# Patient Record
Sex: Female | Born: 1990 | Race: Black or African American | Hispanic: No | Marital: Single | Smoking: Former smoker
Health system: Southern US, Community
[De-identification: ages and names within clinical notes are randomized; demographics above are authoritative.]

## PROBLEM LIST (undated history)

## (undated) DIAGNOSIS — J45909 Unspecified asthma, uncomplicated: Secondary | ICD-10-CM

## (undated) DIAGNOSIS — R519 Headache, unspecified: Secondary | ICD-10-CM

## (undated) DIAGNOSIS — I10 Essential (primary) hypertension: Secondary | ICD-10-CM

## (undated) DIAGNOSIS — K219 Gastro-esophageal reflux disease without esophagitis: Secondary | ICD-10-CM

## (undated) HISTORY — PX: WISDOM TOOTH EXTRACTION: SHX21

---

## 2013-10-18 HISTORY — PX: ECTOPIC PREGNANCY SURGERY: SHX613

## 2013-12-30 ENCOUNTER — Emergency Department (HOSPITAL_COMMUNITY)
Admission: EM | Admit: 2013-12-30 | Discharge: 2013-12-30 | Disposition: A | Discharge status: Home / Self Care | Payer: Self-pay | Attending: Emergency Medicine | Admitting: Emergency Medicine

## 2013-12-30 ENCOUNTER — Emergency Department (HOSPITAL_COMMUNITY): Payer: Self-pay

## 2013-12-30 ENCOUNTER — Encounter (HOSPITAL_COMMUNITY): Payer: Self-pay | Admitting: Emergency Medicine

## 2013-12-30 DIAGNOSIS — Y939 Activity, unspecified: Secondary | ICD-10-CM | POA: Insufficient documentation

## 2013-12-30 DIAGNOSIS — F17200 Nicotine dependence, unspecified, uncomplicated: Secondary | ICD-10-CM | POA: Insufficient documentation

## 2013-12-30 DIAGNOSIS — S61409A Unspecified open wound of unspecified hand, initial encounter: Secondary | ICD-10-CM | POA: Insufficient documentation

## 2013-12-30 DIAGNOSIS — S61218A Laceration without foreign body of other finger without damage to nail, initial encounter: Secondary | ICD-10-CM

## 2013-12-30 DIAGNOSIS — Y929 Unspecified place or not applicable: Secondary | ICD-10-CM | POA: Insufficient documentation

## 2013-12-30 DIAGNOSIS — W268XXA Contact with other sharp object(s), not elsewhere classified, initial encounter: Secondary | ICD-10-CM | POA: Insufficient documentation

## 2013-12-30 DIAGNOSIS — Z79899 Other long term (current) drug therapy: Secondary | ICD-10-CM | POA: Insufficient documentation

## 2013-12-30 DIAGNOSIS — S61209A Unspecified open wound of unspecified finger without damage to nail, initial encounter: Secondary | ICD-10-CM | POA: Insufficient documentation

## 2013-12-30 DIAGNOSIS — S61411A Laceration without foreign body of right hand, initial encounter: Secondary | ICD-10-CM

## 2013-12-30 DIAGNOSIS — Z792 Long term (current) use of antibiotics: Secondary | ICD-10-CM | POA: Insufficient documentation

## 2013-12-30 DIAGNOSIS — I10 Essential (primary) hypertension: Secondary | ICD-10-CM | POA: Insufficient documentation

## 2013-12-30 DIAGNOSIS — IMO0001 Reserved for inherently not codable concepts without codable children: Secondary | ICD-10-CM | POA: Insufficient documentation

## 2013-12-30 HISTORY — DX: Essential (primary) hypertension: I10

## 2013-12-30 MED ORDER — CEPHALEXIN 250 MG PO CAPS
500.0000 mg | ORAL_CAPSULE | Freq: Once | ORAL | Status: AC
  Administered 2013-12-30: 500 mg via ORAL
  Filled 2013-12-30: qty 2

## 2013-12-30 MED ORDER — IBUPROFEN 200 MG PO TABS
600.0000 mg | ORAL_TABLET | Freq: Once | ORAL | Status: AC
  Administered 2013-12-30: 600 mg via ORAL
  Filled 2013-12-30: qty 3

## 2013-12-30 MED ORDER — IBUPROFEN 600 MG PO TABS: 600.0000 mg | ORAL_TABLET | Freq: Four times a day (QID) | ORAL | Status: DC | PRN

## 2013-12-30 MED ORDER — CEPHALEXIN 500 MG PO CAPS: 500.0000 mg | ORAL_CAPSULE | Freq: Four times a day (QID) | ORAL | Status: DC

## 2013-12-30 NOTE — Discharge Instructions (Signed)
We saw you in the ER for your WOUND. Please read the instructions provided on wound care. Keep the area clean and dry, apply bacitracin or neosporin ointment daily and take the medications provided. RETURN TO THE ER IF THERE IS INCREASED PAIN, REDNESS, PUS COMING OUT from the wound site.   Laceration Care, Adult A laceration is a cut or lesion that goes through all layers of the skin and into the tissue just beneath the skin. TREATMENT  Some lacerations may not require closure. Some lacerations may not be able to be closed due to an increased risk of infection. It is important to see your caregiver as soon as possible after an injury to minimize the risk of infection and maximize the opportunity for successful closure. If closure is appropriate, pain medicines may be given, if needed. The wound will be cleaned to help prevent infection. Your caregiver will use stitches (sutures), staples, wound glue (adhesive), or skin adhesive strips to repair the laceration. These tools bring the skin edges together to allow for faster healing and a better cosmetic outcome. However, all wounds will heal with a scar. Once the wound has healed, scarring can be minimized by covering the wound with sunscreen during the day for 1 full year. HOME CARE INSTRUCTIONS  For sutures or staples:  Keep the wound clean and dry.  If you were given a bandage (dressing), you should change it at least once a day. Also, change the dressing if it becomes wet or dirty, or as directed by your caregiver.  Wash the wound with soap and water 2 times a day. Rinse the wound off with water to remove all soap. Pat the wound dry with a clean towel.  After cleaning, apply a thin layer of the antibiotic ointment as recommended by your caregiver. This will help prevent infection and keep the dressing from sticking.  You may shower as usual after the first 24 hours. Do not soak the wound in water until the sutures are removed.  Only take  over-the-counter or prescription medicines for pain, discomfort, or fever as directed by your caregiver.  Get your sutures or staples removed as directed by your caregiver. For skin adhesive strips:  Keep the wound clean and dry.  Do not get the skin adhesive strips wet. You may bathe carefully, using caution to keep the wound dry.  If the wound gets wet, pat it dry with a clean towel.  Skin adhesive strips will fall off on their own. You may trim the strips as the wound heals. Do not remove skin adhesive strips that are still stuck to the wound. They will fall off in time. For wound adhesive:  You may briefly wet your wound in the shower or bath. Do not soak or scrub the wound. Do not swim. Avoid periods of heavy perspiration until the skin adhesive has fallen off on its own. After showering or bathing, gently pat the wound dry with a clean towel.  Do not apply liquid medicine, cream medicine, or ointment medicine to your wound while the skin adhesive is in place. This may loosen the film before your wound is healed.  If a dressing is placed over the wound, be careful not to apply tape directly over the skin adhesive. This may cause the adhesive to be pulled off before the wound is healed.  Avoid prolonged exposure to sunlight or tanning lamps while the skin adhesive is in place. Exposure to ultraviolet light in the first year will darken the  scar.  The skin adhesive will usually remain in place for 5 to 10 days, then naturally fall off the skin. Do not pick at the adhesive film. You may need a tetanus shot if:  You cannot remember when you had your last tetanus shot.  You have never had a tetanus shot. If you get a tetanus shot, your arm may swell, get red, and feel warm to the touch. This is common and not a problem. If you need a tetanus shot and you choose not to have one, there is a rare chance of getting tetanus. Sickness from tetanus can be serious. SEEK MEDICAL CARE IF:   You  have redness, swelling, or increasing pain in the wound.  You see a red line that goes away from the wound.  You have yellowish-white fluid (pus) coming from the wound.  You have a fever.  You notice a bad smell coming from the wound or dressing.  Your wound breaks open before or after sutures have been removed.  You notice something coming out of the wound such as wood or glass.  Your wound is on your hand or foot and you cannot move a finger or toe. SEEK IMMEDIATE MEDICAL CARE IF:   Your pain is not controlled with prescribed medicine.  You have severe swelling around the wound causing pain and numbness or a change in color in your arm, hand, leg, or foot.  Your wound splits open and starts bleeding.  You have worsening numbness, weakness, or loss of function of any joint around or beyond the wound.  You develop painful lumps near the wound or on the skin anywhere on your body. MAKE SURE YOU:   Understand these instructions.  Will watch your condition.  Will get help right away if you are not doing well or get worse. Document Released: 10/04/2005 Document Revised: 12/27/2011 Document Reviewed: 03/30/2011 [facility] Patient Information 2014 [employer], [place].

## 2013-12-30 NOTE — ED Provider Notes (Signed)
CSN: [id number]     Arrival date & time 12/30/13  0053 History   First MD Initiated Contact with Patient 12/30/13 0229     Chief Complaint  Patient presents with  . Extremity Laceration     (Consider location/radiation/quality/duration/timing/severity/associated sxs/prior Treatment) HPI Comments: SUBJECTIVE:  23 y.o. female sustained laceration of hand and finger prior to ED arrival. Ashby Dawes of injury: accidentally hit a glass window. Tetanus vaccination status reviewed: last tetanus booster within 10 years. Bleeding is well controlled and patient has pain at the site of the injury, no numbness. Denies intoxication. Injury to right hand, and patient is right handed    The history is provided by the patient.    Past Medical History  Diagnosis Date  . Hypertension    History reviewed. No pertinent past surgical history. History reviewed. No pertinent family history. History  Substance Use Topics  . Smoking status: Current Some Day Smoker    Types: Cigars  . Smokeless tobacco: Not on file  . Alcohol Use: Yes   OB History   Grav Para Term Preterm Abortions TAB SAB Ect Mult Living                 Review of Systems  Musculoskeletal: Positive for myalgias.  Skin: Positive for wound.  Neurological: Negative for headaches.  Hematological: Does not bruise/bleed easily.      Allergies  Review of patient's allergies indicates no known allergies.  Home Medications   Current Outpatient Rx  Name  Route  Sig  Dispense  Refill  . hydrochlorothiazide (HYDRODIURIL) 25 MG tablet   Oral   Take 25 mg by mouth daily.         Marland Kitchen labetalol (NORMODYNE) 200 MG tablet   Oral   Take 200 mg by mouth 2 (two) times daily.         . cephALEXin (KEFLEX) 500 MG capsule   Oral   Take 1 capsule (500 mg total) by mouth 4 (four) times daily.   20 capsule   0   . ibuprofen (ADVIL,MOTRIN) 600 MG tablet   Oral   Take 1 tablet (600 mg total) by mouth every 6 (six) hours as needed.   30  tablet   0    BP 158/110  Pulse 122  Temp(Src) 100 F (37.8 C) (Oral)  Resp 18  SpO2 96%  LMP 10/01/2013 Physical Exam  Nursing note and vitals reviewed. Constitutional: She appears well-developed.  Eyes: EOM are normal.  Neck: Neck supple.  Pulmonary/Chest: Effort normal and breath sounds normal.  Musculoskeletal:  Right wrist- there is a 5 cm laceration, deep at the ulnar aspect, volar side that extends laterally. No tendon exposure, no active bleeding.  Right small finger, between the pip and dip, there is a 1 cm laceration on the dorsal aspect.  Pt able to flex and extend over all the ip joints and mcp joints, and able to flex and extend the wrist. Pt's sensory exam is normal, and she is able to discriminate between sharp and dull, and sensation is equal throughout the hand  Neurological: She is alert.  Skin: Skin is warm and dry.    ED Course  Procedures (including critical care time) Labs Review Labs Reviewed - No data to display Imaging Review Dg Wrist Complete Right  12/30/2013   CLINICAL DATA:  Laceration  EXAM: RIGHT WRIST - COMPLETE 3+ VIEW  COMPARISON:  None.  FINDINGS: Medial laceration at the wrist. Negative for foreign body or fracture.  IMPRESSION: Negative for fracture or foreign body.   Electronically Signed   By: Marlan Palauharles  Clark M.D.   On: 12/30/2013 03:38     EKG Interpretation None      MDM   Final diagnoses:  Laceration of hand, right  Laceration of finger, little   LACERATION REPAIR Performed by: Derwood KaplanNanavati, Thane Age Authorized by: Derwood KaplanNanavati, Jyoti Harju Consent: Verbal consent obtained. Risks and benefits: risks, benefits and alternatives were discussed Consent given by: patient Patient identity confirmed: provided demographic data Prepped and Draped in normal sterile fashion Wound explored  Laceration Location: right wrist and right small finger  Laceration Length: 6 cm  No Foreign Bodies seen or palpated  Anesthesia: local  infiltration  Local anesthetic: lidocaine 1 % W/ epinephrine  Anesthetic total: 4 ml  Irrigation method: syringe Amount of cleaning: standard  Skin closure: primary  Number of sutures: 11 total  Technique: simple interrupted  Patient tolerance: Patient tolerated the procedure well with no immediate complications.  Pt comes in post laceration repair. Wound was copiously irrigated, and stitched up. Return precautions discussed. Tetanus UTD. Keflex given for prophylaxis.   Derwood KaplanAnkit Kyeisha Janowicz, MD 12/30/13 [phone]

## 2013-12-30 NOTE — ED Notes (Signed)
Rhunette CroftNanavati, MD is aware of the pt's blood pressure, and consents to the let the pt leave the hospital.

## 2013-12-30 NOTE — ED Notes (Addendum)
PResetns with laceration to right posterior wrist from a glass window. Bleeding controlled. Small laceration to pinky finger. Wrapped with gauze, CMS intact.  Admits to ETOH use.

## 2014-07-03 ENCOUNTER — Emergency Department (HOSPITAL_COMMUNITY)
Admission: EM | Admit: 2014-07-03 | Discharge: 2014-07-03 | Disposition: A | Discharge status: Home / Self Care | Payer: Self-pay | Attending: Emergency Medicine | Admitting: Emergency Medicine

## 2014-07-03 ENCOUNTER — Encounter (HOSPITAL_COMMUNITY): Payer: Self-pay | Admitting: Emergency Medicine

## 2014-07-03 DIAGNOSIS — R197 Diarrhea, unspecified: Secondary | ICD-10-CM | POA: Insufficient documentation

## 2014-07-03 DIAGNOSIS — R748 Abnormal levels of other serum enzymes: Secondary | ICD-10-CM | POA: Insufficient documentation

## 2014-07-03 DIAGNOSIS — Z79899 Other long term (current) drug therapy: Secondary | ICD-10-CM | POA: Insufficient documentation

## 2014-07-03 DIAGNOSIS — R109 Unspecified abdominal pain: Secondary | ICD-10-CM | POA: Insufficient documentation

## 2014-07-03 DIAGNOSIS — I10 Essential (primary) hypertension: Secondary | ICD-10-CM | POA: Insufficient documentation

## 2014-07-03 DIAGNOSIS — Z3202 Encounter for pregnancy test, result negative: Secondary | ICD-10-CM | POA: Insufficient documentation

## 2014-07-03 DIAGNOSIS — F17200 Nicotine dependence, unspecified, uncomplicated: Secondary | ICD-10-CM | POA: Insufficient documentation

## 2014-07-03 DIAGNOSIS — R112 Nausea with vomiting, unspecified: Secondary | ICD-10-CM | POA: Insufficient documentation

## 2014-07-03 LAB — COMPREHENSIVE METABOLIC PANEL
ALT: 17 U/L (ref 0–35)
AST: 18 U/L (ref 0–37)
Albumin: 3.4 g/dL — ABNORMAL LOW (ref 3.5–5.2)
Alkaline Phosphatase: 110 U/L (ref 39–117)
Anion gap: 12 (ref 5–15)
BUN: 6 mg/dL (ref 6–23)
CALCIUM: 9.1 mg/dL (ref 8.4–10.5)
CO2: 25 mEq/L (ref 19–32)
Chloride: 101 mEq/L (ref 96–112)
Creatinine, Ser: 0.76 mg/dL (ref 0.50–1.10)
GFR calc non Af Amer: >90 mL/min (ref >90)
GLUCOSE: 110 mg/dL — AB (ref 70–99)
Potassium: 3.4 mEq/L — ABNORMAL LOW (ref 3.7–5.3)
SODIUM: 138 meq/L (ref 137–147)
TOTAL PROTEIN: 7.6 g/dL (ref 6.0–8.3)
Total Bilirubin: 0.5 mg/dL (ref 0.3–1.2)

## 2014-07-03 LAB — URINALYSIS, ROUTINE W REFLEX MICROSCOPIC
Bilirubin Urine: NEGATIVE
GLUCOSE, UA: NEGATIVE mg/dL
Hgb urine dipstick: NEGATIVE
Ketones, ur: 15 mg/dL — AB
Leukocytes, UA: NEGATIVE
NITRITE: NEGATIVE
PROTEIN: NEGATIVE mg/dL
Specific Gravity, Urine: 1.021 (ref 1.005–1.030)
UROBILINOGEN UA: 0.2 mg/dL (ref 0.0–1.0)
pH: 6.5 (ref 5.0–8.0)

## 2014-07-03 LAB — CBC WITH DIFFERENTIAL/PLATELET
BASOS ABS: 0 10*3/uL (ref 0.0–0.1)
Basophils Relative: 0 % (ref 0–1)
EOS PCT: 1 % (ref 0–5)
Eosinophils Absolute: 0.1 10*3/uL (ref 0.0–0.7)
HCT: 41.7 % (ref 36.0–46.0)
Hemoglobin: 13.9 g/dL (ref 12.0–15.0)
LYMPHS ABS: 2.8 10*3/uL (ref 0.7–4.0)
Lymphocytes Relative: 33 % (ref 12–46)
MCH: 26.4 pg (ref 26.0–34.0)
MCHC: 33.3 g/dL (ref 30.0–36.0)
MCV: 79.3 fL (ref 78.0–100.0)
Monocytes Absolute: 0.4 10*3/uL (ref 0.1–1.0)
Monocytes Relative: 5 % (ref 3–12)
Neutro Abs: 5.2 10*3/uL (ref 1.7–7.7)
Neutrophils Relative %: 61 % (ref 43–77)
PLATELETS: 325 10*3/uL (ref 150–400)
RBC: 5.26 MIL/uL — ABNORMAL HIGH (ref 3.87–5.11)
RDW: 13.8 % (ref 11.5–15.5)
WBC: 8.5 10*3/uL (ref 4.0–10.5)

## 2014-07-03 LAB — LIPASE, BLOOD: Lipase: 109 U/L — ABNORMAL HIGH (ref 11–59)

## 2014-07-03 LAB — POC URINE PREG, ED: PREG TEST UR: NEGATIVE

## 2014-07-03 MED ORDER — LOPERAMIDE HCL 2 MG PO CAPS
4.0000 mg | ORAL_CAPSULE | Freq: Once | ORAL | Status: AC
  Administered 2014-07-03: 4 mg via ORAL
  Filled 2014-07-03: qty 2

## 2014-07-03 MED ORDER — SODIUM CHLORIDE 0.9 % IV BOLUS (SEPSIS): 1000.0000 mL | Freq: Once | INTRAVENOUS | Status: DC

## 2014-07-03 MED ORDER — ONDANSETRON 4 MG PO TBDP: 4.0000 mg | ORAL_TABLET | Freq: Three times a day (TID) | ORAL | Status: DC | PRN

## 2014-07-03 MED ORDER — LOPERAMIDE HCL 2 MG PO CAPS: 2.0000 mg | ORAL_CAPSULE | Freq: Four times a day (QID) | ORAL | Status: DC | PRN

## 2014-07-03 NOTE — Discharge Instructions (Signed)
Please read and follow all provided instructions.  Your diagnoses today include:  1. Nausea vomiting and diarrhea   2. Essential hypertension   3. Elevated lipase     Tests performed today include:  Blood counts and electrolytes  Blood tests to check liver and kidney function  Blood tests to check pancreas function - mildly high  Urine test to look for infection and pregnancy (in women)  Vital signs. See below for your results today.   Medications prescribed:   Imodium - medication for diarrhea   Zofran (ondansetron) - for nausea and vomiting  Take any prescribed medications only as directed.  Home care instructions:   Follow any educational materials contained in this packet.   Your abdominal pain, nausea, vomiting, and diarrhea may be caused by a viral gastroenteritis also called 'stomach flu'. You should rest for the next several days. Keep drinking plenty of fluids and use the medicine for nausea as directed.    Drink clear liquids for the next 24 hours and introduce solid foods slowly after 24 hours using the b.r.a.t. diet (Bananas, Rice, Applesauce, Toast, Yogurt).    Follow-up instructions: Please follow-up with your primary care provider in the next 7 days for further evaluation of your symptoms and elevated lipase.  Return instructions:  SEEK IMMEDIATE MEDICAL ATTENTION IF:  If you have pain that does not go away or becomes severe   A temperature above 101F develops   Repeated vomiting occurs (multiple episodes)   If you have pain that becomes localized to portions of the abdomen. The right side could possibly be appendicitis. In an adult, the left lower portion of the abdomen could be colitis or diverticulitis.   Blood is being passed in stools or vomit (bright red or black tarry stools)   You develop chest pain, difficulty breathing, dizziness or fainting, or become confused, poorly responsive, or inconsolable (young children)  If you have any other  emergent concerns regarding your health  Additional Information: Abdominal (belly) pain can be caused by many things. Your caregiver performed an examination and possibly ordered blood/urine tests and imaging (CT scan, x-rays, ultrasound). Many cases can be observed and treated at home after initial evaluation in the emergency department. Even though you are being discharged home, abdominal pain can be unpredictable. Therefore, you need a repeated exam if your pain does not resolve, returns, or worsens. Most patients with abdominal pain don't have to be admitted to the hospital or have surgery, but serious problems like appendicitis and gallbladder attacks can start out as nonspecific pain. Many abdominal conditions cannot be diagnosed in one visit, so follow-up evaluations are very important.  Your vital signs today were: BP 148/119   Pulse 94   Temp(Src) 98.8 F (37.1 C) (Oral)   Resp 18   SpO2 100% If your blood pressure (bp) was elevated above 135/85 this visit, please have this repeated by your doctor within one month. --------------

## 2014-07-03 NOTE — ED Notes (Signed)
Patient discharged with all personal belongings. 

## 2014-07-03 NOTE — ED Notes (Addendum)
Patient given PO fluids, tolerated without any difficulty. PA made aware of patients elevated BP. Patient states she has not taken any of her home BP medication today. Educated patient on the risk factors of not taking her medication and having an elevated BP for extended period of time.

## 2014-07-03 NOTE — ED Notes (Signed)
Pt in c/o n/v/d and abd cramping for two days, denies fever at home, no distress noted

## 2014-07-03 NOTE — ED Provider Notes (Signed)
CSN: [id number]     Arrival date & time 07/03/14  1552 History   First MD Initiated Contact with Patient 07/03/14 1924     Chief Complaint  Patient presents with  . N/V/D      (Consider location/radiation/quality/duration/timing/severity/associated sxs/prior Treatment) HPI Comments: Patient presents with complaint of nausea, vomiting, and diarrhea for 2 days. Patient has had abdominal pain only with urge to vomit or have a bowel movement and the pain is not persistent. This pain is cramping and generalized and does not radiate. Patient has not had a fever. She has not had any dysuria, hematuria. Vomiting is nonbloody, nonbilious. Patient has not noted melena or blood in stool. Patient states that her last menstrual period was one month ago and was normal for her. No treatments prior to arrival. Patient should be taking HCTZ at home but is noncompliant. She denies heavy NSAID use or heavy alcohol use. No recent exotic travel however patient recently moved to [place] from [place] 2 weeks ago. No recent antibiotic use. No history of Crohn's disease or ulcerative colitis. She has been eating light foods and drinking water at home.   The history is provided by the patient.    Past Medical History  Diagnosis Date  . Hypertension    History reviewed. No pertinent past surgical history. History reviewed. No pertinent family history. History  Substance Use Topics  . Smoking status: Current Some Day Smoker    Types: Cigars  . Smokeless tobacco: Not on file  . Alcohol Use: Yes   OB History   Grav Para Term Preterm Abortions TAB SAB Ect Mult Living                 Review of Systems  Constitutional: Negative for fever.  HENT: Negative for rhinorrhea and sore throat.   Eyes: Negative for redness.  Respiratory: Negative for cough.   Cardiovascular: Negative for chest pain.  Gastrointestinal: Positive for nausea, vomiting, abdominal pain and diarrhea. Negative for constipation and  blood in stool.  Genitourinary: Negative for dysuria.  Musculoskeletal: Negative for myalgias.  Skin: Negative for rash.  Neurological: Negative for headaches.    Allergies  Review of patient's allergies indicates no known allergies.  Home Medications   Prior to Admission medications   Medication Sig Start Date End Date Taking? Authorizing Provider  hydrochlorothiazide (HYDRODIURIL) 25 MG tablet Take 25 mg by mouth daily.   Yes Historical Provider, MD  ibuprofen (ADVIL,MOTRIN) 200 MG tablet Take 400 mg by mouth every 6 (six) hours as needed for mild pain.   Yes Historical Provider, MD  labetalol (NORMODYNE) 200 MG tablet Take 200 mg by mouth 2 (two) times daily.   Yes Historical Provider, MD   BP 155/108  Pulse 116  Temp(Src) 98.8 F (37.1 C) (Oral)  Resp 18  SpO2 98%  Physical Exam  Nursing note and vitals reviewed. Constitutional: She appears well-developed and well-nourished.  HENT:  Head: Normocephalic and atraumatic.  Mouth/Throat: Oropharynx is clear and moist.  Eyes: Conjunctivae are normal. Right eye exhibits no discharge. Left eye exhibits no discharge.  Neck: Normal range of motion. Neck supple.  Cardiovascular: Normal rate, regular rhythm and normal heart sounds.   Pulmonary/Chest: Effort normal and breath sounds normal. No respiratory distress. She has no wheezes. She has no rales.  Abdominal: Soft. Bowel sounds are normal. She exhibits no distension. There is no tenderness. There is no rebound and no guarding.  Obese  Neurological: She is alert.  Skin: Skin is warm  and dry.  Psychiatric: She has a normal mood and affect.    ED Course  Procedures (including critical care time) Labs Review Labs Reviewed  CBC WITH DIFFERENTIAL - Abnormal; Notable for the following:    RBC 5.26 (*)    All other components within normal limits  COMPREHENSIVE METABOLIC PANEL - Abnormal; Notable for the following:    Potassium 3.4 (*)    Glucose, Bld 110 (*)    Albumin 3.4  (*)    All other components within normal limits  LIPASE, BLOOD - Abnormal; Notable for the following:    Lipase 109 (*)    All other components within normal limits  URINALYSIS, ROUTINE W REFLEX MICROSCOPIC - Abnormal; Notable for the following:    Color, Urine AMBER (*)    APPearance CLOUDY (*)    Ketones, ur 15 (*)    All other components within normal limits  POC URINE PREG, ED    Imaging Review No results found.   EKG Interpretation None      Patient seen and examined. Work-up initiated. Medications ordered. She declines IV fluids, will ensure she drinks without vomiting.   Family/patient advised of elevated lipase and to be aware of this, have this rechecked in future. Pt has no pain, I explained I do not think this is pancreatitis or related to gallstones.   Vital signs reviewed and are as follows: BP 155/108  Pulse 116  Temp(Src) 98.8 F (37.1 C) (Oral)  Resp 18  SpO2 98%  Pt discharged with zofran and imodium for symptom control.   BP 144/122  Pulse 79  Temp(Src) 98.8 F (37.1 C) (Oral)  Resp 16  SpO2 100%  The patient was urged to return to the Emergency Department immediately with worsening of current symptoms, worsening abdominal pain, persistent vomiting, blood noted in stools, fever, or any other concerns. The patient verbalized understanding.    MDM   Final diagnoses:  Nausea vomiting and diarrhea  Essential hypertension  Elevated lipase   N/V/D: no clinical signs of dehydration. Labs are reassuring however lipase is elevated. Patient does not have any clinical signs and symptoms of pancreatitis. I do not suspect bile duct obstruction. Her abdomen is soft and nontender. No indication for additional imaging tonight.  Hypertension: Patient has been noncompliant with her oral medications, she is counseled to resume these. No gross evidence of end-organ damage.     Renne Crigler, PA-C 07/04/14 [phone]

## 2014-07-04 NOTE — ED Provider Notes (Signed)
Medical screening examination/treatment/procedure(s) were performed by non-physician practitioner and as supervising physician I was immediately available for consultation/collaboration.   EKG Interpretation None       Arby Barrette, MD 07/04/14 [phone]

## 2018-10-18 HISTORY — PX: DEEP SHOULDER TUMOR EXCISION: SUR577

## 2020-10-08 ENCOUNTER — Other Ambulatory Visit: Payer: Self-pay

## 2020-10-08 ENCOUNTER — Encounter (HOSPITAL_COMMUNITY): Payer: Self-pay

## 2020-10-08 DIAGNOSIS — D509 Iron deficiency anemia, unspecified: Secondary | ICD-10-CM | POA: Diagnosis present

## 2020-10-08 DIAGNOSIS — K8020 Calculus of gallbladder without cholecystitis without obstruction: Principal | ICD-10-CM | POA: Diagnosis present

## 2020-10-08 DIAGNOSIS — Z888 Allergy status to other drugs, medicaments and biological substances status: Secondary | ICD-10-CM

## 2020-10-08 DIAGNOSIS — F17290 Nicotine dependence, other tobacco product, uncomplicated: Secondary | ICD-10-CM | POA: Diagnosis present

## 2020-10-08 DIAGNOSIS — E876 Hypokalemia: Secondary | ICD-10-CM | POA: Diagnosis present

## 2020-10-08 DIAGNOSIS — Z79899 Other long term (current) drug therapy: Secondary | ICD-10-CM

## 2020-10-08 DIAGNOSIS — Z6841 Body Mass Index (BMI) 40.0 and over, adult: Secondary | ICD-10-CM

## 2020-10-08 DIAGNOSIS — I10 Essential (primary) hypertension: Secondary | ICD-10-CM | POA: Diagnosis present

## 2020-10-08 DIAGNOSIS — K219 Gastro-esophageal reflux disease without esophagitis: Secondary | ICD-10-CM | POA: Diagnosis present

## 2020-10-08 DIAGNOSIS — E875 Hyperkalemia: Secondary | ICD-10-CM | POA: Diagnosis present

## 2020-10-08 DIAGNOSIS — D72819 Decreased white blood cell count, unspecified: Secondary | ICD-10-CM | POA: Diagnosis present

## 2020-10-08 DIAGNOSIS — R000 Tachycardia, unspecified: Secondary | ICD-10-CM | POA: Diagnosis present

## 2020-10-08 DIAGNOSIS — J45909 Unspecified asthma, uncomplicated: Secondary | ICD-10-CM | POA: Diagnosis present

## 2020-10-08 DIAGNOSIS — U071 COVID-19: Secondary | ICD-10-CM | POA: Diagnosis present

## 2020-10-08 DIAGNOSIS — R7989 Other specified abnormal findings of blood chemistry: Secondary | ICD-10-CM | POA: Diagnosis present

## 2020-10-08 DIAGNOSIS — Z23 Encounter for immunization: Secondary | ICD-10-CM

## 2020-10-08 LAB — URINALYSIS, ROUTINE W REFLEX MICROSCOPIC
Bacteria, UA: NONE SEEN
Glucose, UA: NEGATIVE mg/dL
Hgb urine dipstick: NEGATIVE
Ketones, ur: 5 mg/dL — AB
Leukocytes,Ua: NEGATIVE
Nitrite: NEGATIVE
Protein, ur: 30 mg/dL — AB
Specific Gravity, Urine: 1.018 (ref 1.005–1.030)
pH: 6 (ref 5.0–8.0)

## 2020-10-08 LAB — CBC
HCT: 38.7 % (ref 36.0–46.0)
Hemoglobin: 12 g/dL (ref 12.0–15.0)
MCH: 23.2 pg — ABNORMAL LOW (ref 26.0–34.0)
MCHC: 31 g/dL (ref 30.0–36.0)
MCV: 74.7 fL — ABNORMAL LOW (ref 80.0–100.0)
Platelets: 371 10*3/uL (ref 150–400)
RBC: 5.18 MIL/uL — ABNORMAL HIGH (ref 3.87–5.11)
RDW: 16.5 % — ABNORMAL HIGH (ref 11.5–15.5)
WBC: 3.7 10*3/uL — ABNORMAL LOW (ref 4.0–10.5)
nRBC: 0 % (ref 0.0–0.2)

## 2020-10-08 LAB — COMPREHENSIVE METABOLIC PANEL
ALT: 340 U/L — ABNORMAL HIGH (ref 0–44)
AST: 732 U/L — ABNORMAL HIGH (ref 15–41)
Albumin: 3.9 g/dL (ref 3.5–5.0)
Alkaline Phosphatase: 232 U/L — ABNORMAL HIGH (ref 38–126)
Anion gap: 12 (ref 5–15)
BUN: 9 mg/dL (ref 6–20)
CO2: 27 mmol/L (ref 22–32)
Calcium: 9 mg/dL (ref 8.9–10.3)
Chloride: 100 mmol/L (ref 98–111)
Creatinine, Ser: 0.73 mg/dL (ref 0.44–1.00)
GFR, Estimated: >60 mL/min (ref >60)
Glucose, Bld: 107 mg/dL — ABNORMAL HIGH (ref 70–99)
Potassium: 3 mmol/L — ABNORMAL LOW (ref 3.5–5.1)
Sodium: 139 mmol/L (ref 135–145)
Total Bilirubin: 2.8 mg/dL — ABNORMAL HIGH (ref 0.3–1.2)
Total Protein: 8.1 g/dL (ref 6.5–8.1)

## 2020-10-08 LAB — I-STAT BETA HCG BLOOD, ED (MC, WL, AP ONLY): I-stat hCG, quantitative: <5 m[IU]/mL (ref <5)

## 2020-10-08 LAB — LIPASE, BLOOD: Lipase: 26 U/L (ref 11–51)

## 2020-10-08 NOTE — ED Triage Notes (Signed)
Pt reports lower back and upper abdominal pain beginning this morning. Pt sts omeprazole usually helps but did not today.

## 2020-10-09 ENCOUNTER — Emergency Department (HOSPITAL_COMMUNITY): Payer: Medicaid Other

## 2020-10-09 ENCOUNTER — Observation Stay (HOSPITAL_COMMUNITY): Payer: Medicaid Other

## 2020-10-09 ENCOUNTER — Inpatient Hospital Stay (HOSPITAL_COMMUNITY)
Admission: EM | Admit: 2020-10-09 | Discharge: 2020-10-10 | DRG: 444 | Disposition: A | Discharge status: Home / Self Care | Payer: Medicaid Other | Attending: Internal Medicine | Admitting: Internal Medicine

## 2020-10-09 DIAGNOSIS — Z79899 Other long term (current) drug therapy: Secondary | ICD-10-CM | POA: Diagnosis not present

## 2020-10-09 DIAGNOSIS — I10 Essential (primary) hypertension: Secondary | ICD-10-CM | POA: Diagnosis present

## 2020-10-09 DIAGNOSIS — J45909 Unspecified asthma, uncomplicated: Secondary | ICD-10-CM | POA: Diagnosis present

## 2020-10-09 DIAGNOSIS — D509 Iron deficiency anemia, unspecified: Secondary | ICD-10-CM | POA: Diagnosis present

## 2020-10-09 DIAGNOSIS — K8020 Calculus of gallbladder without cholecystitis without obstruction: Secondary | ICD-10-CM | POA: Diagnosis present

## 2020-10-09 DIAGNOSIS — R7989 Other specified abnormal findings of blood chemistry: Secondary | ICD-10-CM | POA: Diagnosis present

## 2020-10-09 DIAGNOSIS — D72819 Decreased white blood cell count, unspecified: Secondary | ICD-10-CM | POA: Diagnosis present

## 2020-10-09 DIAGNOSIS — R933 Abnormal findings on diagnostic imaging of other parts of digestive tract: Secondary | ICD-10-CM

## 2020-10-09 DIAGNOSIS — K219 Gastro-esophageal reflux disease without esophagitis: Secondary | ICD-10-CM | POA: Diagnosis present

## 2020-10-09 DIAGNOSIS — Z23 Encounter for immunization: Secondary | ICD-10-CM | POA: Diagnosis not present

## 2020-10-09 DIAGNOSIS — R000 Tachycardia, unspecified: Secondary | ICD-10-CM | POA: Diagnosis present

## 2020-10-09 DIAGNOSIS — Z6841 Body Mass Index (BMI) 40.0 and over, adult: Secondary | ICD-10-CM | POA: Diagnosis not present

## 2020-10-09 DIAGNOSIS — E66813 Obesity, class 3: Secondary | ICD-10-CM | POA: Diagnosis present

## 2020-10-09 DIAGNOSIS — Z888 Allergy status to other drugs, medicaments and biological substances status: Secondary | ICD-10-CM | POA: Diagnosis not present

## 2020-10-09 DIAGNOSIS — U071 COVID-19: Secondary | ICD-10-CM | POA: Diagnosis present

## 2020-10-09 DIAGNOSIS — E876 Hypokalemia: Secondary | ICD-10-CM | POA: Diagnosis present

## 2020-10-09 DIAGNOSIS — F17290 Nicotine dependence, other tobacco product, uncomplicated: Secondary | ICD-10-CM | POA: Diagnosis present

## 2020-10-09 DIAGNOSIS — R1011 Right upper quadrant pain: Secondary | ICD-10-CM | POA: Diagnosis present

## 2020-10-09 DIAGNOSIS — E875 Hyperkalemia: Secondary | ICD-10-CM | POA: Diagnosis present

## 2020-10-09 DIAGNOSIS — K8050 Calculus of bile duct without cholangitis or cholecystitis without obstruction: Secondary | ICD-10-CM

## 2020-10-09 LAB — CBC
HCT: 35 % — ABNORMAL LOW (ref 36.0–46.0)
Hemoglobin: 10.9 g/dL — ABNORMAL LOW (ref 12.0–15.0)
MCH: 23.3 pg — ABNORMAL LOW (ref 26.0–34.0)
MCHC: 31.1 g/dL (ref 30.0–36.0)
MCV: 74.8 fL — ABNORMAL LOW (ref 80.0–100.0)
Platelets: 309 10*3/uL (ref 150–400)
RBC: 4.68 MIL/uL (ref 3.87–5.11)
RDW: 16.4 % — ABNORMAL HIGH (ref 11.5–15.5)
WBC: 4.4 10*3/uL (ref 4.0–10.5)
nRBC: 0 % (ref 0.0–0.2)

## 2020-10-09 LAB — COMPREHENSIVE METABOLIC PANEL
ALT: 344 U/L — ABNORMAL HIGH (ref 0–44)
AST: 545 U/L — ABNORMAL HIGH (ref 15–41)
Albumin: 3.5 g/dL (ref 3.5–5.0)
Alkaline Phosphatase: 220 U/L — ABNORMAL HIGH (ref 38–126)
Anion gap: 12 (ref 5–15)
BUN: 7 mg/dL (ref 6–20)
CO2: 26 mmol/L (ref 22–32)
Calcium: 8.4 mg/dL — ABNORMAL LOW (ref 8.9–10.3)
Chloride: 102 mmol/L (ref 98–111)
Creatinine, Ser: 0.64 mg/dL (ref 0.44–1.00)
GFR, Estimated: >60 mL/min (ref >60)
Glucose, Bld: 135 mg/dL — ABNORMAL HIGH (ref 70–99)
Potassium: 2.6 mmol/L — CL (ref 3.5–5.1)
Sodium: 140 mmol/L (ref 135–145)
Total Bilirubin: 3.6 mg/dL — ABNORMAL HIGH (ref 0.3–1.2)
Total Protein: 7.7 g/dL (ref 6.5–8.1)

## 2020-10-09 LAB — MAGNESIUM: Magnesium: 1.7 mg/dL (ref 1.7–2.4)

## 2020-10-09 LAB — RESP PANEL BY RT-PCR (FLU A&B, COVID) ARPGX2
Influenza A by PCR: NEGATIVE
Influenza B by PCR: NEGATIVE
SARS Coronavirus 2 by RT PCR: POSITIVE — AB

## 2020-10-09 LAB — POTASSIUM: Potassium: 3.7 mmol/L (ref 3.5–5.1)

## 2020-10-09 LAB — TSH: TSH: 0.766 u[IU]/mL (ref 0.350–4.500)

## 2020-10-09 MED ORDER — POTASSIUM CHLORIDE IN NACL 20-0.9 MEQ/L-% IV SOLN
INTRAVENOUS | Status: DC
  Filled 2020-10-09 (×4): qty 1000

## 2020-10-09 MED ORDER — MORPHINE SULFATE (PF) 4 MG/ML IV SOLN
4.0000 mg | INTRAVENOUS | Status: AC | PRN
  Administered 2020-10-09 (×3): 4 mg via INTRAVENOUS
  Filled 2020-10-09 (×3): qty 1

## 2020-10-09 MED ORDER — ONDANSETRON HCL 4 MG PO TABS: 4.0000 mg | ORAL_TABLET | Freq: Four times a day (QID) | ORAL | Status: DC | PRN

## 2020-10-09 MED ORDER — IBUPROFEN 200 MG PO TABS
400.0000 mg | ORAL_TABLET | Freq: Four times a day (QID) | ORAL | Status: DC | PRN
  Administered 2020-10-10: 400 mg via ORAL
  Filled 2020-10-09: qty 2

## 2020-10-09 MED ORDER — MAGNESIUM SULFATE 2 GM/50ML IV SOLN
2.0000 g | Freq: Once | INTRAVENOUS | Status: AC
  Administered 2020-10-09: 10:00:00 2 g via INTRAVENOUS
  Filled 2020-10-09: qty 50

## 2020-10-09 MED ORDER — POTASSIUM CHLORIDE 10 MEQ/100ML IV SOLN
10.0000 meq | INTRAVENOUS | Status: DC
  Administered 2020-10-09 (×3): 10 meq via INTRAVENOUS
  Filled 2020-10-09 (×3): qty 100

## 2020-10-09 MED ORDER — ONDANSETRON HCL 4 MG/2ML IJ SOLN
4.0000 mg | Freq: Four times a day (QID) | INTRAMUSCULAR | Status: DC | PRN
  Administered 2020-10-10: 10:00:00 4 mg via INTRAVENOUS
  Filled 2020-10-09: qty 2

## 2020-10-09 MED ORDER — HYDRALAZINE HCL 25 MG PO TABS
25.0000 mg | ORAL_TABLET | Freq: Three times a day (TID) | ORAL | Status: DC | PRN
Start: 2020-10-09 — End: 2020-10-10

## 2020-10-09 MED ORDER — POTASSIUM CHLORIDE 10 MEQ/100ML IV SOLN: 10.0000 meq | INTRAVENOUS | Status: DC

## 2020-10-09 MED ORDER — GADOBUTROL 1 MMOL/ML IV SOLN
10.0000 mL | Freq: Once | INTRAVENOUS | Status: AC | PRN
  Administered 2020-10-09: 10 mL via INTRAVENOUS

## 2020-10-09 MED ORDER — KCL-LACTATED RINGERS 20 MEQ/L IV SOLN
INTRAVENOUS | Status: DC
  Filled 2020-10-09: qty 1000

## 2020-10-09 MED ORDER — ONDANSETRON HCL 4 MG/2ML IJ SOLN
4.0000 mg | Freq: Once | INTRAMUSCULAR | Status: AC
  Administered 2020-10-09: 4 mg via INTRAVENOUS
  Filled 2020-10-09: qty 2

## 2020-10-09 MED ORDER — POTASSIUM CHLORIDE CRYS ER 20 MEQ PO TBCR
40.0000 meq | EXTENDED_RELEASE_TABLET | ORAL | Status: AC
Start: 2020-10-09 — End: 2020-10-09
  Administered 2020-10-09 (×2): 40 meq via ORAL
  Filled 2020-10-09 (×2): qty 2

## 2020-10-09 MED ORDER — POTASSIUM CHLORIDE 20 MEQ PO PACK
40.0000 meq | PACK | Freq: Once | ORAL | Status: AC
  Administered 2020-10-09: 20:00:00 40 meq via ORAL
  Filled 2020-10-09: qty 2

## 2020-10-09 MED ORDER — POTASSIUM CITRATE-CITRIC ACID 1100-334 MG/5ML PO SOLN: 40.0000 meq | Freq: Once | ORAL | Status: DC

## 2020-10-09 MED ORDER — LACTATED RINGERS IV SOLN: INTRAVENOUS | Status: DC

## 2020-10-09 MED ORDER — SODIUM CHLORIDE 0.9 % IV SOLN: INTRAVENOUS | Status: DC

## 2020-10-09 MED ORDER — MORPHINE SULFATE (PF) 4 MG/ML IV SOLN
4.0000 mg | Freq: Once | INTRAVENOUS | Status: AC
  Administered 2020-10-09: 4 mg via INTRAVENOUS
  Filled 2020-10-09: qty 1

## 2020-10-09 MED ORDER — NIFEDIPINE ER OSMOTIC RELEASE 30 MG PO TB24
90.0000 mg | ORAL_TABLET | Freq: Every day | ORAL | Status: DC
  Administered 2020-10-09 – 2020-10-10 (×2): 90 mg via ORAL
  Filled 2020-10-09 (×2): qty 3

## 2020-10-09 MED ORDER — POTASSIUM CHLORIDE 20 MEQ PO PACK
40.0000 meq | PACK | Freq: Once | ORAL | Status: AC
  Administered 2020-10-09: 05:00:00 40 meq via ORAL
  Filled 2020-10-09: qty 2

## 2020-10-09 NOTE — ED Notes (Signed)
Pt. C/o epigastric pain that radiates into her back that started yesterday morning. Pt denies n/v at this time.

## 2020-10-09 NOTE — H&P (Signed)
History and Physical   Maria Fields:[id number] DOB: 1990/11/18 DOA: 10/09/2020  PCP: Dr. Donetta Potts, OBGyn/PCP Outpatient Specialists: OBGyn, Dr. Donetta Potts, Dr. Barbette Merino (gen surgery), Donalynn Furlong, [facility] Patient coming from: work  I have personally briefly reviewed patient's old medical records in [facility] EMR.  Chief Concern: abdominal pain  HPI: Maria Fields is a 29 y.o. 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   She presented to ED for abdominal pain, nausea, vomiting.  She reports the abdominal pain is in the RUQ region with radiation to her back that started at approximately 11 AM on 10/08/20. She states she's had this pain before in July/August 2021 and was told it was acid reflux. She reports the pain is sharp, 10/10, and now a 0/10 after IV pain medication. She does not notice eating affecting the pain. She vomited once consisting of orange Hawaiian punch at approximately 12- 1 pm on 10/08/20.   She denies numbness or tingling of her upper extremities, swelling in her legs. She had a headache that was throbbing on right frontal 8/10 that has now resolved.  Constitutional: No Weight Change, No Fever ENT/Mouth: No sore throat, No Rhinorrhea, odynophagia Eyes: No Eye Pain, No Vision Changes Cardiovascular: No Chest Pain, no SOB,  No Edema, No Palpitations Respiratory: No Cough, No Sputum,  Gastrointestinal: + Nausea, + Vomiting, + Diarrhea (3x orange, no blood) Genitourinary: no Urinary Incontinence, dysuria, hematuria  Musculoskeletal: No Arthralgias, No Myalgias Skin: No Skin Lesions, No Pruritus Neuro: - Weakness, - Numbness,  No Loss of  Consciousness, No Syncope Psych: No Anxiety/Panic, No Depression, + decrease appetite Heme/Lymph: No Bruising, No Bleeding  Vaccinations: she is unvaccinated for covid 23  Social history: she currently works at [employer] in [place], [place]. She formerly smoked black and mild filtered cigars and quit in 2018. She infrequently drinks etoh and her last drink was Saturday (4 cocktails) on 10/04/20. She denies recreational drug use.   ED Course: Discussed with ED provider patient will need hospitalization for right upper quadrant abdominal pain suspect secondary to choledocholithiasis.  Vital signs in the ED was afebrile and showed hypertension and sinus tachycardia.  Remarkable labs were reviewed.  Assessment/Plan  Principal Problem:   Choledocholithiasis Active Problems:   Essential hypertension   Obesity, Class III, BMI 40-49.9 (morbid obesity) (HCC)   GERD (gastroesophageal reflux disease)   Sinus tachycardia   Hypokalemia due to excessive gastrointestinal loss of potassium   Elevated LFTs   Right upper quadrant abdominal pain suspect secondary to choledocholithiasis -Right upper quadrant ultrasound of the abdomen was read as cholelithiasis without evidence of acute cholecystitis -Pain control with IV morphine as needed for severe pain, ondansetron as needed for nausea -MRCP ordered -LR IVF at 100 cc/h to complete 10 hours -Consultation has not been done pending MRCP -Patient is n.p.o.  Elevated LFT - suspect secondary to choledocholithiasis, cmp in the AM  Essential hypertension with superimposed pre-eclampsia  -nifedipine 90 mg daily resumed -Hydralazine 25 mg 3 times daily as needed for SBP greater than 180  Sinus tachycardia-suspect secondary to abdominal pain  Hypokalemia - suspect secondary to GI loss -Replaced with PO potassium chloride 40 mEq once and repeat potassium was 2.6 - Potassium chloride 10 mEq q1h x  5, ordered.   -LR at 100 cc/h to complete 10 hours -  Magnesium was normal at 1.7  - Will repeat mag  - Repeat STAT ekg ordered   Obesity-morbid, class III, patient is at risk for developing metabolic syndrome -Extensive counseling has been given to patient to pursue weight loss via diet and exercise as tolerated  As needed meds: Ibuprofen, ondansetron, hydralazine  Chart reviewed.   DVT prophylaxis: TED hose Code Status: Full code  Diet: N.p.o., meds permitting Family Communication: updated mother over the phone and sister at bedside Disposition Plan: pending clinical course Consults called: patient will need consult pending MRCP Admission status: Observation with telemetry  Past Medical History:  Diagnosis Date  . Hypertension    History reviewed. No pertinent surgical history.  Social History:  reports that she has been smoking cigars. She has never used smokeless tobacco. She reports current alcohol use. She reports that she does not use drugs.  Allergies  Allergen Reactions  . Oseltamivir Hives and Rash  . Prednisone Hives   Family history: Family history reviewed and not pertinent  Prior to Admission medications   Medication Sig Start Date End Date Taking? Authorizing Provider  hydrochlorothiazide (HYDRODIURIL) 25 MG tablet Take 25 mg by mouth daily.    [provider]  ibuprofen (ADVIL,MOTRIN) 200 MG tablet Take 400 mg by mouth every 6 (six) hours as needed for mild pain.    [provider]  labetalol (NORMODYNE) 200 MG tablet Take 200 mg by mouth 2 (two) times daily.    [provider]  loperamide (IMODIUM) 2 MG capsule Take 1 capsule (2 mg total) by mouth 4 (four) times daily as needed for diarrhea or loose stools. 07/03/14   Renne Crigler, PA-C  ondansetron (ZOFRAN ODT) 4 MG disintegrating tablet Take 1 tablet (4 mg total) by mouth every 8 (eight) hours as needed for nausea or vomiting. 07/03/14   Renne Crigler, PA-C   Physical Exam: Vitals:   10/08/20 2158 10/09/20 0131  BP: (!) 145/116  (!) 141/88  Pulse: (!) 113 99  Resp: 16 14  Temp: 98.7 F (37.1 C)   TempSrc: Oral   SpO2: 100% 100%  Weight: 98.4 kg   Height: 5\' 1"  (1.549 m)    Constitutional: appears age-appropriate, NAD, calm, comfortable Eyes: PERRL, lids and conjunctivae normal ENMT: Mucous membranes are moist. Posterior pharynx clear of any exudate or lesions. Age-appropriate dentition. Hearing appropriate Neck: normal, supple, no masses, no thyromegaly Respiratory: clear to auscultation bilaterally, no wheezing, no crackles. Normal respiratory effort. No accessory muscle use.  Cardiovascular: Regular rate and rhythm, no murmurs / rubs / gallops. No extremity edema. 2+ pedal pulses. No carotid bruits.  Abdomen: Obese abdomen with pannus, no tenderness, no masses palpated, no hepatosplenomegaly. Bowel sounds positive.  Musculoskeletal: no clubbing / cyanosis. No joint deformity upper and lower extremities. Good ROM, no contractures, no atrophy. Normal muscle tone.  Skin: no rashes, lesions, ulcers. No induration.  Multiple chronic appearing tattoos Neurologic: Sensation intact. Strength 5/5 in all 4.  Psychiatric: Normal judgment and insight. Alert and oriented x 3. Normal mood.   EKG: ordered and Independently reviewed, showing sinus tachycardia with rate of 96, qtc 458  Imaging on Admission: Personally reviewed and I agree with radiologist reading as below.  Abdomen Limited RUQ (LIVER/GB)  Result Date: 10/09/2020 CLINICAL DATA:  Right upper quadrant abdominal pain. EXAM: ULTRASOUND ABDOMEN LIMITED RIGHT UPPER QUADRANT COMPARISON:  None. FINDINGS: Gallbladder: Multiple shadowing mobile gallstones are present. Mobile  sludge is present. Gallbladder wall thickness is within normal limits at 2.2 mm. No sonographic Eulah Pont sign is reported. Common bile duct: Diameter: 6 mm, upper limits of normal. Liver: No focal lesion identified. Within normal limits in parenchymal echogenicity. Portal vein is patent on color  Doppler imaging with normal direction of blood flow towards the liver. Other: None. IMPRESSION: 1. Cholelithiasis without evidence for acute cholecystitis. Electronically Signed   By: Marin Roberts M.D.   On: 10/09/2020 02:35   Labs on Admission: I have personally reviewed following labs  CBC: Recent Labs  Lab 10/08/20 2214  WBC 3.7*  HGB 12.0  HCT 38.7  MCV 74.7*  PLT 371   Basic Metabolic Panel: Recent Labs  Lab 10/08/20 2214  NA 139  K 3.0*  CL 100  CO2 27  GLUCOSE 107*  BUN 9  CREATININE 0.73  CALCIUM 9.0   GFR: Estimated Creatinine Clearance: 113.4 mL/min (by C-G formula based on SCr of 0.73 mg/dL).  Liver Function Tests: Recent Labs  Lab 10/08/20 2214  AST 732*  ALT 340*  ALKPHOS 232*  BILITOT 2.8*  PROT 8.1  ALBUMIN 3.9   Recent Labs  Lab 10/08/20 2214  LIPASE 26   Urine analysis:    Component Value Date/Time   COLORURINE AMBER (A) 10/08/2020 2215   APPEARANCEUR CLOUDY (A) 10/08/2020 2215   LABSPEC 1.018 10/08/2020 2215   PHURINE 6.0 10/08/2020 2215   GLUCOSEU NEGATIVE 10/08/2020 2215   HGBUR NEGATIVE 10/08/2020 2215   BILIRUBINUR SMALL (A) 10/08/2020 2215   KETONESUR 5 (A) 10/08/2020 2215   PROTEINUR 30 (A) 10/08/2020 2215   UROBILINOGEN 0.2 07/03/2014 2003   NITRITE NEGATIVE 10/08/2020 2215   LEUKOCYTESUR NEGATIVE 10/08/2020 2215   Mckayla Mulcahey N Daria Mcmeekin D.O. [facility]  If 12AM-7AM, please contact overnight-coverage provider If 7AM-7PM, please contact day coverage provider [email or web address]  10/09/2020, 4:28 AM

## 2020-10-09 NOTE — Care Plan (Signed)
-  Received call/curbside consult from hospitalist regarding abnormal MRI concerning for gallbladder neck stone.  Patient has mild elevated T bili around 3.6 .  MRI negative for choledocholithiasis.  -Patient with COVID-19 positive on screening.  No respiratory symptoms.  -Patient not seen /not  examined.   - Recommend surgery consultation for cholecystectomy.  -  If abnormal IOC, please call us back.  Her mild elevated LFTs are probably from gallbladder related issues. -Please call us back if abnormal LFTs persist after gallbladder surgery.  Kathi Der MD, FACP 10/09/2020, 1:08 PM  Contact #  [phone]

## 2020-10-09 NOTE — ED Provider Notes (Signed)
TIME SEEN: 1:17 AM  CHIEF COMPLAINT: Abdominal pain, vomiting  HPI: Patient is a 29 year old female with history of hypertension who presents to the emergency department with sharp, severe RUQ domino pain that started today.  Has had nausea and vomiting.  No diarrhea.  No fever.  No dysuria, hematuria, vaginal bleeding or discharge.  No previous abdominal surgery.  She has never been told that she has gallstones before.    ROS: See HPI Constitutional: no fever  Eyes: no drainage  ENT: no runny nose   Cardiovascular:  no chest pain  Resp: no SOB  GI:  vomiting GU: no dysuria Integumentary: no rash  Allergy: no hives  Musculoskeletal: no leg swelling  Neurological: no slurred speech ROS otherwise negative  PAST MEDICAL HISTORY/PAST SURGICAL HISTORY:  Past Medical History:  Diagnosis Date  . Hypertension     MEDICATIONS:  Prior to Admission medications   Medication Sig Start Date End Date Taking? Authorizing Provider  hydrochlorothiazide (HYDRODIURIL) 25 MG tablet Take 25 mg by mouth daily.    [provider]  ibuprofen (ADVIL,MOTRIN) 200 MG tablet Take 400 mg by mouth every 6 (six) hours as needed for mild pain.    [provider]  labetalol (NORMODYNE) 200 MG tablet Take 200 mg by mouth 2 (two) times daily.    [provider]  loperamide (IMODIUM) 2 MG capsule Take 1 capsule (2 mg total) by mouth 4 (four) times daily as needed for diarrhea or loose stools. 07/03/14   Renne Crigler, PA-C  ondansetron (ZOFRAN ODT) 4 MG disintegrating tablet Take 1 tablet (4 mg total) by mouth every 8 (eight) hours as needed for nausea or vomiting. 07/03/14   Renne Crigler, PA-C    ALLERGIES:  Allergies  Allergen Reactions  . Oseltamivir Hives and Rash  . Prednisone Hives    SOCIAL HISTORY:  Social History   Tobacco Use  . Smoking status: Current Some Day Smoker    Types: Cigars  . Smokeless tobacco: Never Used  Substance Use Topics  . Alcohol use: Yes     FAMILY HISTORY: No family history on file.  EXAM: BP (!) 145/116 (BP Location: Left Arm)   Pulse (!) 113   Temp 98.7 F (37.1 C) (Oral)   Resp 16   Ht 5\' 1"  (1.549 m)   Wt 98.4 kg   SpO2 100%   BMI 41.00 kg/m  CONSTITUTIONAL: Alert and oriented and responds appropriately to questions. Well-appearing; well-nourished HEAD: Normocephalic EYES: Conjunctivae clear, pupils appear equal, EOM appear intact ENT: normal nose; moist mucous membranes NECK: Supple, normal ROM CARD: RRR; S1 and S2 appreciated; no murmurs, no clicks, no rubs, no gallops RESP: Normal chest excursion without splinting or tachypnea; breath sounds clear and equal bilaterally; no wheezes, no rhonchi, no rales, no hypoxia or respiratory distress, speaking full sentences ABD/GI: Normal bowel sounds; non-distended; soft, tender in the right upper quadrant with negative Murphy sign, no tenderness at McBurney's point BACK:  The back appears normal EXT: Normal ROM in all joints; no deformity noted, no edema; no cyanosis SKIN: Normal color for age and race; warm; no rash on exposed skin NEURO: Moves all extremities equally PSYCH: The patient's mood and manner are appropriate.   MEDICAL DECISION MAKING: Patient here with concerns for cholelithiasis versus choledocholithiasis versus cholecystitis.  Labs show elevated liver function test but normal lipase.  Will obtain right upper quadrant ultrasound.  Will give IV fluids, pain and nausea medicine.  Will keep n.p.o.  ED PROGRESS: Ultrasound  shows cholelithiasis without obstruction or cholecystitis.  Will discuss with hospitalist for admission.  Patient may benefit from MRCP versus ERCP.  She may have also recently passed a stone.  Pain is resolved after 1 dose of morphine.  Discussed patient's case with hospitalist, Dr. Sedalia Muta.  I have recommended admission and patient (and family if present) agree with this plan. Admitting physician will place admission orders.   I reviewed  all nursing notes, vitals, pertinent previous records and reviewed/interpreted all EKGs, lab and urine results, imaging (as available).     EKG Interpretation  Date/Time:  Thursday October 09 2020 04:28:36 EST Ventricular Rate:  96 PR Interval:    QRS Duration: 94 QT Interval:  362 QTC Calculation: 458 R Axis:   66 Text Interpretation: Sinus rhythm No old tracing to compare Confirmed by Saagar Tortorella, Baxter Hire [phone]) on 10/09/2020 4:38:56 AM        Maria Fields was evaluated in Emergency Department on 10/09/2020 for the symptoms described in the history of present illness. She was evaluated in the context of the global COVID-19 pandemic, which necessitated consideration that the patient might be at risk for infection with the SARS-CoV-2 virus that causes COVID-19. Institutional protocols and algorithms that pertain to the evaluation of patients at risk for COVID-19 are in a state of rapid change based on information released by regulatory bodies including the CDC and federal and state organizations. These policies and algorithms were followed during the patient's care in the ED.      Maria Fields, Maria Maw, DO 10/09/20 [phone]

## 2020-10-09 NOTE — Consult Note (Signed)
Consult Note  Maria Fields [birth year]  [id number].    Requesting MD: Dr. Bonnielee Haff Chief Complaint/Reason for Consult: Cholelithiasis  HPI:  Patient is a 29 year old female who presented to [facility] with RUQ abdominal pain that started around 11 AM yesterday. Reports pain is sharp in nature, severe, and radiates to the back. She reports similar pain over the last month but was told it was acid reflux. Has been taking omeprazole. Associated nausea and vomiting, chills and diarrhea. IV pain medication helped. She does not notice a particular association with eating. She denies fever, chest pain, SOB, urinary symptoms.  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 Past surgical history also includes cesarean section x2. Reports occasional alcohol use, denies tobacco or illicit drug use. Reports allergies to prednisone and tamiflu, reaction is hives. No known sick contacts. Has not been vaccinated against COVID.  ROS: Review of Systems  Constitutional: Positive for chills. Negative for fever.  Respiratory: Negative for shortness of breath and wheezing.   Cardiovascular: Negative for chest pain and palpitations.  Gastrointestinal: Positive for abdominal pain, diarrhea, nausea and vomiting. Negative for blood in stool, constipation and melena.  Genitourinary: Negative for dysuria, frequency and urgency.  All other systems reviewed and are negative.   No family history on file.  Past Medical History:  Diagnosis Date  . Hypertension     History reviewed. No pertinent surgical history.  Social History:  reports that she has been smoking cigars. She has never used smokeless tobacco. She reports current alcohol use. She reports that she does not use drugs.  Allergies:  Allergies  Allergen Reactions  . Oseltamivir Hives and Rash  . Prednisone Hives    (Not in a hospital  admission)   Blood pressure 107/72, pulse 90, temperature 98.7 F (37.1 C), temperature source Oral, resp. rate 19, height 5' 1" (1.549 m), weight 98.4 kg, SpO2 96 %. Physical Exam:  General: pleasant, WD, obese female who is laying in bed in NAD HEENT: head is normocephalic, atraumatic.  Sclera are anicteric.  PERRL.  Mask over nose and mouth Heart: sinus tachycardia in the low 100s. Palpable radial and pedal pulses bilaterally Lungs: No audible wheezing. Respiratory effort nonlabored. O2 sat 100% on room air Abd: soft, NT, negative Murphy sign, ND, +BS, no masses, hernias, or organomegaly MS: all 4 extremities are symmetrical with no cyanosis, clubbing, or edema. Skin: warm and dry with no masses, lesions, or rashes Neuro: Cranial nerves 2-12 grossly intact, sensation is normal throughout Psych: A&Ox3 with an appropriate affect.   Results for orders placed or performed during the hospital encounter of 10/09/20 (from the past 48 hour(s))  Lipase, blood     Status: None   Collection Time: 10/08/20 10:14 PM  Result Value Ref Range   Lipase 26 11 - 51 U/L    Comment: Performed at [facility], [place] [address] [place], [place] [address]  Comprehensive metabolic panel     Status: Abnormal   Collection Time: 10/08/20 10:14 PM  Result Value Ref Range   Sodium 139 135 - 145 mmol/L   Potassium 3.0 (L) 3.5 - 5.1 mmol/L   Chloride 100 98 - 111 mmol/L   CO2 27 22 - 32 mmol/L   Glucose, Bld 107 (H) 70 - 99 mg/dL    Comment: Glucose reference range applies only to samples taken after  fasting for at least 8 hours.   BUN 9 6 - 20 mg/dL   Creatinine, Ser 0.73 0.44 - 1.00 mg/dL   Calcium 9.0 8.9 - 10.3 mg/dL   Total Protein 8.1 6.5 - 8.1 g/dL   Albumin 3.9 3.5 - 5.0 g/dL   AST 732 (H) 15 - 41 U/L   ALT 340 (H) 0 - 44 U/L   Alkaline Phosphatase 232 (H) 38 - 126 U/L   Total Bilirubin 2.8 (H) 0.3 - 1.2 mg/dL   GFR, Estimated >60 >60 mL/min    Comment: (NOTE) Calculated using  the CKD-EPI Creatinine Equation (2021)    Anion gap 12 5 - 15    Comment: Performed at [facility], [place] [address], [place], [place] [address]  CBC     Status: Abnormal   Collection Time: 10/08/20 10:14 PM  Result Value Ref Range   WBC 3.7 (L) 4.0 - 10.5 K/uL   RBC 5.18 (H) 3.87 - 5.11 MIL/uL   Hemoglobin 12.0 12.0 - 15.0 g/dL   HCT 38.7 36.0 - 46.0 %   MCV 74.7 (L) 80.0 - 100.0 fL   MCH 23.2 (L) 26.0 - 34.0 pg   MCHC 31.0 30.0 - 36.0 g/dL   RDW 16.5 (H) 11.5 - 15.5 %   Platelets 371 150 - 400 K/uL   nRBC 0.0 0.0 - 0.2 %    Comment: Performed at [facility], [place] [address] [place], [place] [address]  Urinalysis, Routine w reflex microscopic Urine, Clean Catch     Status: Abnormal   Collection Time: 10/08/20 10:15 PM  Result Value Ref Range   Color, Urine AMBER (A) YELLOW    Comment: BIOCHEMICALS MAY BE AFFECTED BY COLOR   APPearance CLOUDY (A) CLEAR   Specific Gravity, Urine 1.018 1.005 - 1.030   pH 6.0 5.0 - 8.0   Glucose, UA NEGATIVE NEGATIVE mg/dL   Hgb urine dipstick NEGATIVE NEGATIVE   Bilirubin Urine SMALL (A) NEGATIVE   Ketones, ur 5 (A) NEGATIVE mg/dL   Protein, ur 30 (A) NEGATIVE mg/dL   Nitrite NEGATIVE NEGATIVE   Leukocytes,Ua NEGATIVE NEGATIVE   RBC / HPF 0-5 0 - 5 RBC/hpf   WBC, UA 6-10 0 - 5 WBC/hpf   Bacteria, UA NONE SEEN NONE SEEN   Squamous Epithelial / LPF 11-20 0 - 5   Mucus PRESENT     Comment: Performed at [facility], [place] [address] [place], [place] [address]  I-Stat beta hCG blood, ED     Status: None   Collection Time: 10/08/20 10:19 PM  Result Value Ref Range   I-stat hCG, quantitative <5.0 <5 mIU/mL   Comment 3            Comment:   GEST. AGE      CONC.  (mIU/mL)   <=1 WEEK        5 - 50     2 WEEKS       50 - 500     3 WEEKS       100 - 10,000     4 WEEKS     1,000 - 30,000        FEMALE AND NON-PREGNANT FEMALE:     LESS THAN 5 mIU/mL   TSH     Status: None   Collection Time:  10/09/20  4:36 AM  Result Value Ref Range   TSH 0.766 0.350 - 4.500 uIU/mL    Comment: Performed by a  3rd Generation assay with a functional sensitivity of <=0.01 uIU/mL. Performed at [facility], [place] [address], [place], [place] [address]   Comprehensive metabolic panel     Status: Abnormal   Collection Time: 10/09/20  4:36 AM  Result Value Ref Range   Sodium 140 135 - 145 mmol/L   Potassium 2.6 (LL) 3.5 - 5.1 mmol/L    Comment: CRITICAL RESULT CALLED TO, READ BACK BY AND VERIFIED WITH: RN Bethann Humble AT 1191 10/09/20 CRUICKSHANK A    Chloride 102 98 - 111 mmol/L   CO2 26 22 - 32 mmol/L   Glucose, Bld 135 (H) 70 - 99 mg/dL    Comment: Glucose reference range applies only to samples taken after fasting for at least 8 hours.   BUN 7 6 - 20 mg/dL   Creatinine, Ser 0.64 0.44 - 1.00 mg/dL   Calcium 8.4 (L) 8.9 - 10.3 mg/dL   Total Protein 7.7 6.5 - 8.1 g/dL   Albumin 3.5 3.5 - 5.0 g/dL   AST 545 (H) 15 - 41 U/L   ALT 344 (H) 0 - 44 U/L   Alkaline Phosphatase 220 (H) 38 - 126 U/L   Total Bilirubin 3.6 (H) 0.3 - 1.2 mg/dL   GFR, Estimated >60 >60 mL/min    Comment: (NOTE) Calculated using the CKD-EPI Creatinine Equation (2021)    Anion gap 12 5 - 15    Comment: Performed at [facility], [place] [address], [place], [place] [address]  CBC     Status: Abnormal   Collection Time: 10/09/20  4:36 AM  Result Value Ref Range   WBC 4.4 4.0 - 10.5 K/uL   RBC 4.68 3.87 - 5.11 MIL/uL   Hemoglobin 10.9 (L) 12.0 - 15.0 g/dL   HCT 35.0 (L) 36.0 - 46.0 %   MCV 74.8 (L) 80.0 - 100.0 fL   MCH 23.3 (L) 26.0 - 34.0 pg   MCHC 31.1 30.0 - 36.0 g/dL   RDW 16.4 (H) 11.5 - 15.5 %   Platelets 309 150 - 400 K/uL   nRBC 0.0 0.0 - 0.2 %    Comment: Performed at [facility], [place] [address], [place], [place] [address]  Magnesium     Status: None   Collection Time: 10/09/20  4:36 AM  Result Value Ref Range   Magnesium 1.7 1.7 - 2.4 mg/dL    Comment:  Performed at [facility], [place] [address], [place], [place] [address]  Resp Panel by RT-PCR (Flu A&B, Covid)     Status: Abnormal   Collection Time: 10/09/20  6:20 AM  Result Value Ref Range   SARS Coronavirus 2 by RT PCR POSITIVE (A) NEGATIVE    Comment: RESULT CALLED TO, READ BACK BY AND VERIFIED WITH: JACOBS,D. RN AT 8469 10/09/20 MULLINS,T (NOTE) SARS-CoV-2 target nucleic acids are DETECTED.  The SARS-CoV-2 RNA is generally detectable in upper respiratory specimens during the acute phase of infection. Positive results are indicative of the presence of the identified virus, but do not rule out bacterial infection or co-infection with other pathogens not detected by the test. Clinical correlation with patient history and other diagnostic information is necessary to determine patient infection status. The expected result is Negative.  Fact Sheet for Patients: EntrepreneurPulse.com.au  Fact Sheet for Healthcare Providers: IncredibleEmployment.be  This test is not yet approved or cleared by the [place] FDA and  has been authorized for detection and/or diagnosis of SARS-CoV-2 by FDA under an  Emergency Use Authorization (EUA).  This EUA will remain in effect (meaning this test  can be used) for the duration of  the COVID-19 declaration under Section 564(b)(1) of the Act, 21 U.S.C. section 360bbb-3(b)(1), unless the authorization is terminated or revoked sooner.     Influenza A by PCR NEGATIVE NEGATIVE   Influenza B by PCR NEGATIVE NEGATIVE    Comment: (NOTE) The Xpert Xpress SARS-CoV-2/FLU/RSV plus assay is intended as an aid in the diagnosis of influenza from Nasopharyngeal swab specimens and should not be used as a sole basis for treatment. Nasal washings and aspirates are unacceptable for Xpert Xpress SARS-CoV-2/FLU/RSV testing.  Fact Sheet for Patients: EntrepreneurPulse.com.au  Fact Sheet for  Healthcare Providers: IncredibleEmployment.be  This test is not yet approved or cleared by the [place] FDA and has been authorized for detection and/or diagnosis of SARS-CoV-2 by FDA under an Emergency Use Authorization (EUA). This EUA will remain in effect (meaning this test can be used) for the duration of the COVID-19 declaration under Section 564(b)(1) of the Act, 21 U.S.C. section 360bbb-3(b)(1), unless the authorization is terminated or revoked.  Performed at [facility], [place] [address], [place], [place] [address]    MR 3D Recon At Scanner  Result Date: 10/09/2020 CLINICAL DATA:  Right upper quadrant pain EXAM: MRI ABDOMEN WITHOUT AND WITH CONTRAST (INCLUDING MRCP) TECHNIQUE: Multiplanar multisequence MR imaging of the abdomen was performed both before and after the administration of intravenous contrast. Heavily T2-weighted images of the biliary and pancreatic ducts were obtained, and three-dimensional MRCP images were rendered by post processing. CONTRAST:  54mL GADAVIST GADOBUTROL 1 MMOL/ML IV SOLN COMPARISON:  Same-day right upper quadrant ultrasound FINDINGS: Lower chest: No acute findings. Hepatobiliary: No mass or other parenchymal abnormality identified. Numerous small gallstones in the gallbladder. Tiny stones in or near the gallbladder neck (series 9, image 20). No choledocholithiasis. No biliary ductal dilatation. Incidental note of a low insertion of the cystic duct on the common bile duct. No gallbladder wall thickening or pericholecystic fluid. Pancreas: No mass, inflammatory changes, or other parenchymal abnormality identified. No pancreatic ductal dilatation. Spleen:  Within normal limits in size and appearance. Adrenals/Urinary Tract: No masses identified. No evidence of hydronephrosis. Stomach/Bowel: Visualized portions within the abdomen are unremarkable. Vascular/Lymphatic: No pathologically enlarged lymph nodes identified. No  abdominal aortic aneurysm demonstrated. Other:  None. Musculoskeletal: No suspicious bone lesions identified. IMPRESSION: Numerous small gallstones in the gallbladder, including tiny stones in or near the gallbladder neck. No choledocholithiasis or biliary ductal dilatation. No gallbladder wall thickening or pericholecystic fluid. Electronically Signed   By: Eddie Candle M.D.   On: 10/09/2020 12:43   MR ABDOMEN MRCP W WO CONTAST  Result Date: 10/09/2020 CLINICAL DATA:  Right upper quadrant pain EXAM: MRI ABDOMEN WITHOUT AND WITH CONTRAST (INCLUDING MRCP) TECHNIQUE: Multiplanar multisequence MR imaging of the abdomen was performed both before and after the administration of intravenous contrast. Heavily T2-weighted images of the biliary and pancreatic ducts were obtained, and three-dimensional MRCP images were rendered by post processing. CONTRAST:  40mL GADAVIST GADOBUTROL 1 MMOL/ML IV SOLN COMPARISON:  Same-day right upper quadrant ultrasound FINDINGS: Lower chest: No acute findings. Hepatobiliary: No mass or other parenchymal abnormality identified. Numerous small gallstones in the gallbladder. Tiny stones in or near the gallbladder neck (series 9, image 20). No choledocholithiasis. No biliary ductal dilatation. Incidental note of a low insertion of the cystic duct on the common bile duct. No gallbladder wall thickening or pericholecystic fluid. Pancreas: No mass, inflammatory  changes, or other parenchymal abnormality identified. No pancreatic ductal dilatation. Spleen:  Within normal limits in size and appearance. Adrenals/Urinary Tract: No masses identified. No evidence of hydronephrosis. Stomach/Bowel: Visualized portions within the abdomen are unremarkable. Vascular/Lymphatic: No pathologically enlarged lymph nodes identified. No abdominal aortic aneurysm demonstrated. Other:  None. Musculoskeletal: No suspicious bone lesions identified. IMPRESSION: Numerous small gallstones in the gallbladder, including  tiny stones in or near the gallbladder neck. No choledocholithiasis or biliary ductal dilatation. No gallbladder wall thickening or pericholecystic fluid. Electronically Signed   By: Eddie Candle M.D.   On: 10/09/2020 12:43   US Abdomen Limited RUQ (LIVER/GB)  Result Date: 10/09/2020 CLINICAL DATA:  Right upper quadrant abdominal pain. EXAM: ULTRASOUND ABDOMEN LIMITED RIGHT UPPER QUADRANT COMPARISON:  None. FINDINGS: Gallbladder: Multiple shadowing mobile gallstones are present. Mobile sludge is present. Gallbladder wall thickness is within normal limits at 2.2 mm. No sonographic Percell Miller sign is reported. Common bile duct: Diameter: 6 mm, upper limits of normal. Liver: No focal lesion identified. Within normal limits in parenchymal echogenicity. Portal vein is patent on color Doppler imaging with normal direction of blood flow towards the liver. Other: None. IMPRESSION: 1. Cholelithiasis without evidence for acute cholecystitis. Electronically Signed   By: San Morelle M.D.   On: 10/09/2020 02:35      Assessment/Plan HTN Morbid Obesity Asthma Hx of dermatofibroma of left shoulder - s/p excision 06/08/2019, follows at [place] positive Hypokalemia - K 2.6, likely from GI losses, replacement per primary   Symptomatic cholelithiasis Elevated LFTs with hyperbilirubinemia  - RUQ [place] today shows cholelithiasis without concern for cholecystitis - MRCP today shows cholelithiasis with some stones near gallbladder neck, no choledocholithiasis, no cholecystitis  - Tbili was 2.8 and up to 3.6 this AM, Alk phos 232 on admit now 220, AST/ALT 732/340 on admit and 545/344 today - hepatitis panel ordered  - It seems like patient has likely passed a gallstone and under normal circumstances we would recommend laparoscopic cholecystectomy this admission. In setting of COVID infection would like to try and wait at least 4 weeks given increased risk of pulmonary and VTE complications.  - Recommend repeating  LFTs in AM. If LFTs are not normalizing or pain is significantly worse tomorrow, may need to consider if patient needs cholecystectomy accepting that there is slightly greater risk involved. - NPO after MN, CXR AM in case she were to warrant surgical intervention  FEN: CLD, IVF today VTE: lovenox ID: no abx currently   Norm Parcel, [facility] 10/09/2020, 1:08 PM Please see Amion for pager number during day hours 7:00am-4:30pm

## 2020-10-09 NOTE — Progress Notes (Addendum)
[facility] PROGRESS NOTE   Maria Fields KZS:[id number] DOB: 1991-05-09 DOA: 10/09/2020  PCP: Patient, No Pcp Per  Brief History/Interval Summary: 29 y.o. 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  The pain was in the right upper quadrant.  Ultrasound revealed cholelithiasis with upper limit normal CBD.  Patient was hospitalized for further management.  Found to be incidentally positive for COVID-19.  Reason for Visit: Abdominal pain  Consultants: None yet  Procedures: None yet  Antibiotics: Anti-infectives (From admission, onward)   None      Subjective/Interval History: Patient mentions that her pain is 6 out of 10 in intensity.  Some radiation to the back was present.  Mainly located in the upper abdomen as well as towards the right.  Some nausea and vomiting yesterday.  None this morning.  Fever or chills.  Denies any cough or shortness of breath.    Assessment/Plan:  Right upper quadrant abdominal pain/abnormal LFTs Patient also had epigastric tenderness with radiation of the pain to the back.  Patient found to have elevated transaminases along with alkaline phosphatase and total bilirubin.  Lipase level however was normal.  Abdominal ultrasound showed evidence for cholelithiasis without any cholecystitis.  CBD was upper limits of normal.  Choledocholithiasis is a possibility.  MRCP has been ordered and is pending.  Continue n.p.o. status.  Pain medications have been ordered.  Pain seems to be reasonably well controlled.  Check hepatitis panel if not done recently.  ADDENDUM MRCP was done.   Report reviewed.  No obvious choledocholithiasis noted.  However stones were noted in the gallbladder neck.  Discussed with Dr. Levora Angel with gastroenterology who recommended getting a general surgery consult.  General surgery has been consulted.  Continue to keep n.p.o. for now.  Incidental positive COVID-19 test Noted to be positive for COVID-19.  Patient does not have any respiratory symptoms..  Saturating normal on room air.  Continue to monitor for now.  She does have comorbidities.  We will offer her monoclonal antibody infusion during this hospitalization.  History of essential hypertension Noted to be on nifedipine prior to admission which has been resumed.  Patient also placed on hydralazine as needed.  Pain is likely contributing to elevated blood pressure readings.  Sinus tachycardia Most likely secondary to pain.  Continue to monitor for now.  Hypokalemia Potassium will be repleted aggressively.  Magnesium noted to be 1.7. We will also give her a dose of magnesium intravenously.  Microcytic anemia Drop in hemoglobin is likely dilutional.  No evidence of overt bleeding.  Continue to monitor.  Check anemia panel.  Morbid obesity Estimated body mass index is 41 kg/m as calculated from the following:   Height as of this encounter: 5\' 1"  (1.549 m).   Weight as of this encounter: 98.4 kg.   DVT Prophylaxis: SCDs for now in case procedure is needed Code Status: Full code Family Communication: No family at bedside Disposition Plan: Hopefully return home when improved  Status is: Observation  The patient will require care spanning > 2 midnights and should be moved to inpatient because: Ongoing active pain requiring inpatient pain management, Ongoing diagnostic testing needed not appropriate for outpatient work up, IV treatments  appropriate due to intensity of illness or inability to take PO and Inpatient level of care appropriate due to severity of illness  Dispo: The patient is  from: Home              Anticipated d/c is to: Home              Anticipated d/c date is: 2 days              Patient currently is not medically stable to d/c.       Medications:  Scheduled: . NIFEdipine  90 mg Oral Daily   Continuous: . lactated ringers 100 mL/hr at 10/09/20 0435  . potassium chloride 10 mEq (10/09/20 0838)   WIO:[id number], ibuprofen, morphine injection, ondansetron **OR** ondansetron (ZOFRAN) IV   Objective:  Vital Signs  Vitals:   10/08/20 2158 10/09/20 0131 10/09/20 0752  BP: (!) 145/116 (!) 141/88 120/82  Pulse: (!) 113 99 (!) 109  Resp: 16 14 20   Temp: 98.7 F (37.1 C)    TempSrc: Oral    SpO2: 100% 100% 100%  Weight: 98.4 kg    Height: 5\' 1"  (1.549 m)     No intake or output data in the 24 hours ending 10/09/20 0911 Filed Weights   10/08/20 2158  Weight: 98.4 kg    General appearance: Awake alert.  In no distress Resp: Clear to auscultation bilaterally.  Normal effort Cardio: S1-S2 is normal regular.  No S3-S4.  No rubs murmurs or bruit GI: Abdomen is soft.  Tenderness in the epigastric as well as right upper quadrant.  No rebound rigidity or guarding.  Bowel sounds sluggish but present.  No masses organomegaly.  Extremities: No edema.  Full range of motion of lower extremities. Neurologic: Alert and oriented x3.  No focal neurological deficits.    Lab Results:  Data Reviewed: I have personally reviewed following labs and imaging studies  CBC: Recent Labs  Lab 10/08/20 2214 10/09/20 0436  WBC 3.7* 4.4  HGB 12.0 10.9*  HCT 38.7 35.0*  MCV 74.7* 74.8*  PLT 371 309    Basic Metabolic Panel: Recent Labs  Lab 10/08/20 2214 10/09/20 0436  NA 139 140  K 3.0* 2.6*  CL 100 102  CO2 27 26  GLUCOSE 107* 135*  BUN 9 7  CREATININE 0.73 0.64  CALCIUM 9.0 8.4*  MG  --  1.7    GFR: Estimated Creatinine Clearance: 113.4 mL/min (by C-G formula based on SCr of 0.64 mg/dL).  Liver Function Tests: Recent Labs  Lab  10/08/20 2214 10/09/20 0436  AST 732* 545*  ALT 340* 344*  ALKPHOS 232* 220*  BILITOT 2.8* 3.6*  PROT 8.1 7.7  ALBUMIN 3.9 3.5    Recent Labs  Lab 10/08/20 2214  LIPASE 26    Thyroid Function Tests: Recent Labs    10/09/20 0436  TSH 0.766      Recent Results (from the past 240 hour(s))  Resp Panel by RT-PCR (Flu A&B, Covid)     Status: Abnormal   Collection Time: 10/09/20  6:20 AM  Result Value Ref Range Status   SARS Coronavirus 2 by RT PCR POSITIVE (A) NEGATIVE Final    Comment: RESULT CALLED TO, READ BACK BY AND VERIFIED WITH: JACOBS,D. RN AT 10/11/20 10/09/20 MULLINS,T (NOTE) SARS-CoV-2 target nucleic acids are DETECTED.  The SARS-CoV-2 RNA is generally detectable in upper respiratory specimens during the acute phase of infection. Positive results are indicative of the presence of the identified virus, but  do not rule out bacterial infection or co-infection with other pathogens not detected by the test. Clinical correlation with patient history and other diagnostic information is necessary to determine patient infection status. The expected result is Negative.  Fact Sheet for Patients: BloggerCourse.com  Fact Sheet for Healthcare Providers: SeriousBroker.it  This test is not yet approved or cleared by the [place] FDA and  has been authorized for detection and/or diagnosis of SARS-CoV-2 by FDA under an Emergency Use Authorization (EUA).  This EUA will remain in effect (meaning this test  can be used) for the duration of  the COVID-19 declaration under Section 564(b)(1) of the Act, 21 U.S.C. section 360bbb-3(b)(1), unless the authorization is terminated or revoked sooner.     Influenza A by PCR NEGATIVE NEGATIVE Final   Influenza B by PCR NEGATIVE NEGATIVE Final    Comment: (NOTE) The Xpert Xpress SARS-CoV-2/FLU/RSV plus assay is intended as an aid in the diagnosis of influenza from Nasopharyngeal swab  specimens and should not be used as a sole basis for treatment. Nasal washings and aspirates are unacceptable for Xpert Xpress SARS-CoV-2/FLU/RSV testing.  Fact Sheet for Patients: BloggerCourse.com  Fact Sheet for Healthcare Providers: SeriousBroker.it  This test is not yet approved or cleared by the [place] FDA and has been authorized for detection and/or diagnosis of SARS-CoV-2 by FDA under an Emergency Use Authorization (EUA). This EUA will remain in effect (meaning this test can be used) for the duration of the COVID-19 declaration under Section 564(b)(1) of the Act, 21 U.S.C. section 360bbb-3(b)(1), unless the authorization is terminated or revoked.  Performed at [facility], [address] [place], [place] [address]       Radiology Studies: US Abdomen Limited RUQ (LIVER/GB)  Result Date: 10/09/2020 CLINICAL DATA:  Right upper quadrant abdominal pain. EXAM: ULTRASOUND ABDOMEN LIMITED RIGHT UPPER QUADRANT COMPARISON:  None. FINDINGS: Gallbladder: Multiple shadowing mobile gallstones are present. Mobile sludge is present. Gallbladder wall thickness is within normal limits at 2.2 mm. No sonographic Eulah Pont sign is reported. Common bile duct: Diameter: 6 mm, upper limits of normal. Liver: No focal lesion identified. Within normal limits in parenchymal echogenicity. Portal vein is patent on color Doppler imaging with normal direction of blood flow towards the liver. Other: None. IMPRESSION: 1. Cholelithiasis without evidence for acute cholecystitis. Electronically Signed   By: Marin Roberts M.D.   On: 10/09/2020 02:35       LOS: 0 days   Duvall Comes Rito Ehrlich  [facility] Pager on [email or web address]  10/09/2020, 9:11 AM

## 2020-10-09 NOTE — ED Notes (Signed)
Patient transported to MRI 

## 2020-10-09 NOTE — ED Notes (Signed)
Rito Ehrlich, MD made aware patient is Covid positive.

## 2020-10-10 ENCOUNTER — Inpatient Hospital Stay (HOSPITAL_COMMUNITY): Payer: Medicaid Other

## 2020-10-10 DIAGNOSIS — K8020 Calculus of gallbladder without cholecystitis without obstruction: Principal | ICD-10-CM

## 2020-10-10 LAB — CBC
HCT: 33 % — ABNORMAL LOW (ref 36.0–46.0)
Hemoglobin: 10.1 g/dL — ABNORMAL LOW (ref 12.0–15.0)
MCH: 23.1 pg — ABNORMAL LOW (ref 26.0–34.0)
MCHC: 30.6 g/dL (ref 30.0–36.0)
MCV: 75.3 fL — ABNORMAL LOW (ref 80.0–100.0)
Platelets: 277 10*3/uL (ref 150–400)
RBC: 4.38 MIL/uL (ref 3.87–5.11)
RDW: 17.2 % — ABNORMAL HIGH (ref 11.5–15.5)
WBC: 2.5 10*3/uL — ABNORMAL LOW (ref 4.0–10.5)
nRBC: 0 % (ref 0.0–0.2)

## 2020-10-10 LAB — COMPREHENSIVE METABOLIC PANEL
ALT: 280 U/L — ABNORMAL HIGH (ref 0–44)
AST: 258 U/L — ABNORMAL HIGH (ref 15–41)
Albumin: 3 g/dL — ABNORMAL LOW (ref 3.5–5.0)
Alkaline Phosphatase: 202 U/L — ABNORMAL HIGH (ref 38–126)
Anion gap: 10 (ref 5–15)
BUN: <5 mg/dL — ABNORMAL LOW (ref 6–20)
CO2: 24 mmol/L (ref 22–32)
Calcium: 8 mg/dL — ABNORMAL LOW (ref 8.9–10.3)
Chloride: 105 mmol/L (ref 98–111)
Creatinine, Ser: 0.7 mg/dL (ref 0.44–1.00)
GFR, Estimated: >60 mL/min (ref >60)
Glucose, Bld: 86 mg/dL (ref 70–99)
Potassium: 3.4 mmol/L — ABNORMAL LOW (ref 3.5–5.1)
Sodium: 139 mmol/L (ref 135–145)
Total Bilirubin: 2.9 mg/dL — ABNORMAL HIGH (ref 0.3–1.2)
Total Protein: 6.5 g/dL (ref 6.5–8.1)

## 2020-10-10 LAB — RETICULOCYTES
Immature Retic Fract: 13.8 % (ref 2.3–15.9)
RBC.: 4.36 MIL/uL (ref 3.87–5.11)
Retic Count, Absolute: 73.7 10*3/uL (ref 19.0–186.0)
Retic Ct Pct: 1.7 % (ref 0.4–3.1)

## 2020-10-10 LAB — HEPATITIS PANEL, ACUTE
HCV Ab: NONREACTIVE
Hep A IgM: NONREACTIVE
Hep B C IgM: NONREACTIVE
Hepatitis B Surface Ag: NONREACTIVE

## 2020-10-10 LAB — FOLATE: Folate: 15 ng/mL (ref >5.9)

## 2020-10-10 LAB — IRON AND TIBC
Iron: 52 ug/dL (ref 28–170)
Saturation Ratios: 15 % (ref 10.4–31.8)
TIBC: 341 ug/dL (ref 250–450)
UIBC: 289 ug/dL

## 2020-10-10 LAB — FERRITIN: Ferritin: 44 ng/mL (ref 11–307)

## 2020-10-10 LAB — VITAMIN B12: Vitamin B-12: 487 pg/mL (ref 180–914)

## 2020-10-10 MED ORDER — POTASSIUM CHLORIDE ER 20 MEQ PO TBCR: 20.0000 meq | EXTENDED_RELEASE_TABLET | Freq: Every day | ORAL | 0 refills | Status: DC

## 2020-10-10 MED ORDER — POTASSIUM CHLORIDE 20 MEQ PO PACK
40.0000 meq | PACK | Freq: Once | ORAL | Status: AC
  Administered 2020-10-10: 10:00:00 40 meq via ORAL
  Filled 2020-10-10: qty 2

## 2020-10-10 MED ORDER — SODIUM CHLORIDE 0.9 % IV SOLN: INTRAVENOUS | Status: DC | PRN

## 2020-10-10 MED ORDER — OXYCODONE HCL 5 MG PO TABS
5.0000 mg | ORAL_TABLET | ORAL | Status: DC | PRN
  Administered 2020-10-10: 5 mg via ORAL
  Filled 2020-10-10: qty 1

## 2020-10-10 MED ORDER — ENOXAPARIN SODIUM 40 MG/0.4ML ~~LOC~~ SOLN: 40.0000 mg | Freq: Every day | SUBCUTANEOUS | Status: DC

## 2020-10-10 MED ORDER — SODIUM CHLORIDE 0.9 % IV SOLN
Freq: Once | INTRAVENOUS | Status: AC
  Filled 2020-10-10: qty 700

## 2020-10-10 MED ORDER — EPINEPHRINE 0.3 MG/0.3ML IJ SOAJ
0.3000 mg | Freq: Once | INTRAMUSCULAR | Status: DC | PRN
  Filled 2020-10-10: qty 0.6

## 2020-10-10 MED ORDER — FAMOTIDINE IN NACL 20-0.9 MG/50ML-% IV SOLN: 20.0000 mg | Freq: Once | INTRAVENOUS | Status: DC | PRN

## 2020-10-10 MED ORDER — ONDANSETRON HCL 4 MG PO TABS: 4.0000 mg | ORAL_TABLET | Freq: Three times a day (TID) | ORAL | 0 refills | Status: DC | PRN

## 2020-10-10 MED ORDER — DIPHENHYDRAMINE HCL 50 MG/ML IJ SOLN: 50.0000 mg | Freq: Once | INTRAMUSCULAR | Status: DC | PRN

## 2020-10-10 MED ORDER — OXYCODONE HCL 5 MG PO TABS: 5.0000 mg | ORAL_TABLET | Freq: Four times a day (QID) | ORAL | 0 refills | Status: DC | PRN

## 2020-10-10 MED ORDER — ALBUTEROL SULFATE HFA 108 (90 BASE) MCG/ACT IN AERS: 2.0000 | INHALATION_SPRAY | Freq: Once | RESPIRATORY_TRACT | Status: DC | PRN

## 2020-10-10 NOTE — Discharge Summary (Signed)
[facility]  Physician Discharge Summary   Patient ID: Maria Fields MRN: [id number] DOB/AGE: February 15, 1991 29 y.o.  Admit date: 10/09/2020 Discharge date: 10/11/2020  PCP: Patient, No Pcp Per  DISCHARGE DIAGNOSES:  Symptomatic cholelithiasis COVID-19 virus detected, asymptomatic Essential hypertension Microcytic anemia with iron deficiency Morbid obesity  RECOMMENDATIONS FOR OUTPATIENT FOLLOW UP: 1. Patient to follow-up with general surgery next month for definitive management of cholelithiasis 2. 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  The pain was in the right upper quadrant.  Ultrasound revealed cholelithiasis with upper limit normal CBD.  Patient was hospitalized for further management.  Found to be incidentally positive for COVID-19.    HOSPITAL COURSE:   Symptomatic cholelithiasis Patient presented with abdominal pain and elevated transaminases along with elevated alkaline phosphatase and bilirubin.  She underwent MRCP which did not show any choledocholithiasis.  Stone was noted in the gallbladder neck.  General surgery was subsequently consulted.   Labs look better this morning.  She is tolerating a full liquid diet without significant nausea and vomiting.  Pain is well controlled. Hepatitis panel was  unremarkable.  Patient needs cholecystectomy however since patient is positive for COVID-19, ideally would like to wait till COVID-19 has completely resolved before surgery is offered.   Patient's pain is well controlled with oral medications.  She will be prescribed Zofran at discharge as well.  Wants to go home.  Okay for discharge.     Incidental positive COVID-19 test Noted to be positive for COVID-19.  Patient does not have any respiratory symptoms.  Chest x-ray does not show any acute findings.  She saturating normal on room air.  Due to her comorbidities monoclonal antibody was offered and she is agreeable to the same.  This has been ordered and infused.  Leukopenia is most likely due to viral infection.  History of essential hypertension Continue nifedipine.    Sinus tachycardia Most likely secondary to pain.  Appears to have improved.  Hypokalemia Will be discharged on potassium tablets.    Microcytic anemia/iron deficiency Drop in hemoglobin is likely dilutional.  Noted to be stable this morning.  Anemia panel shows that her ferritin is noted to be on the lower side.  TIBC is elevated.  Folate and B12 levels are normal.  May benefit from iron supplementation.  Can be prescribed once she is over her acute issues.  Morbid obesity Estimated body mass index is 41 kg/m as calculated from the following:   Height as of this encounter: 5\' 1"  (1.549 m).   Weight as of this encounter: 98.4 kg.   Overall she is stable.  Wants to go home today.  Okay for discharge.  PERTINENT LABS:  The results of significant diagnostics from this hospitalization (including imaging, microbiology, ancillary and laboratory) are listed below for reference.    Microbiology: Recent Results (from the past 240 hour(s))  Resp Panel by RT-PCR (Flu A&B, Covid)     Status: Abnormal   Collection Time: 10/09/20  6:20 AM  Result Value Ref Range Status   SARS Coronavirus 2 by RT PCR POSITIVE (A) NEGATIVE  Final    Comment: RESULT CALLED TO, READ BACK BY AND VERIFIED WITH: JACOBS,D. RN AT 9326 10/09/20 MULLINS,T (NOTE) SARS-CoV-2 target nucleic acids are DETECTED.  The SARS-CoV-2 RNA is generally detectable in upper respiratory specimens during the acute phase of infection. Positive results are indicative of the presence of the identified virus, but do not rule out bacterial infection or co-infection with other pathogens not detected by the test. Clinical correlation with patient history and other diagnostic information is necessary to determine patient infection status. The expected result is Negative.  Fact Sheet for Patients: BloggerCourse.com  Fact Sheet for Healthcare Providers: SeriousBroker.it  This test is not yet approved or cleared by the [place] FDA and  has been authorized for detection and/or diagnosis of SARS-CoV-2 by FDA under an Emergency Use Authorization (EUA).  This EUA will remain in effect (meaning this test  can be used) for the duration of  the COVID-19 declaration under Section 564(b)(1) of the Act, 21 U.S.C. section 360bbb-3(b)(1), unless the authorization is terminated or revoked sooner.     Influenza A by PCR NEGATIVE NEGATIVE Final   Influenza B by PCR NEGATIVE NEGATIVE Final    Comment: (NOTE) The Xpert Xpress SARS-CoV-2/FLU/RSV plus assay is intended as an aid in the diagnosis of influenza from Nasopharyngeal swab specimens and should not be used as a sole basis for treatment. Nasal washings and aspirates are unacceptable for Xpert Xpress SARS-CoV-2/FLU/RSV testing.  Fact Sheet for Patients: BloggerCourse.com  Fact Sheet for Healthcare Providers: SeriousBroker.it  This test is not yet approved or cleared by the [place] FDA and has been authorized for detection and/or diagnosis of SARS-CoV-2 by FDA under an Emergency Use Authorization  (EUA). This EUA will remain in effect (meaning this test can be used) for the duration of the COVID-19 declaration under Section 564(b)(1) of the Act, 21 U.S.C. section 360bbb-3(b)(1), unless the authorization is terminated or revoked.  Performed at [facility], [address] [place], [place] [address]      Labs:  COVID-19 Labs  Recent Labs    10/10/20 0416  FERRITIN 44    Lab Results  Component Value Date   SARSCOV2NAA POSITIVE (A) 10/09/2020      Basic Metabolic Panel: Recent Labs  Lab 10/08/20 2214 10/09/20 0436 10/09/20 1642 10/10/20 0416  NA 139 140  --  139  K 3.0* 2.6* 3.7 3.4*  CL 100 102  --  105  CO2 27 26  --  24  GLUCOSE 107* 135*  --  86  BUN 9 7  --  <5*  CREATININE 0.73 0.64  --  0.70  CALCIUM 9.0 8.4*  --  8.0*  MG  --  1.7  --   --    Liver Function Tests: Recent Labs  Lab 10/08/20 2214 10/09/20 0436 10/10/20 0416  AST 732* 545* 258*  ALT 340* 344* 280*  ALKPHOS 232* 220* 202*  BILITOT 2.8* 3.6* 2.9*  PROT 8.1 7.7 6.5  ALBUMIN 3.9 3.5 3.0*   Recent Labs  Lab 10/08/20 2214  LIPASE 26   CBC: Recent Labs  Lab 10/08/20 2214 10/09/20 0436 10/10/20 0416  WBC 3.7* 4.4 2.5*  HGB 12.0 10.9* 10.1*  HCT 38.7 35.0* 33.0*  MCV 74.7* 74.8* 75.3*  PLT 371 309 277     IMAGING STUDIES MR 3D Recon At Scanner  Result Date: 10/09/2020  CLINICAL DATA:  Right upper quadrant pain EXAM: MRI ABDOMEN WITHOUT AND WITH CONTRAST (INCLUDING MRCP) TECHNIQUE: Multiplanar multisequence MR imaging of the abdomen was performed both before and after the administration of intravenous contrast. Heavily T2-weighted images of the biliary and pancreatic ducts were obtained, and three-dimensional MRCP images were rendered by post processing. CONTRAST:  98mL GADAVIST GADOBUTROL 1 MMOL/ML IV SOLN COMPARISON:  Same-day right upper quadrant ultrasound FINDINGS: Lower chest: No acute findings. Hepatobiliary: No mass or other parenchymal  abnormality identified. Numerous small gallstones in the gallbladder. Tiny stones in or near the gallbladder neck (series 9, image 20). No choledocholithiasis. No biliary ductal dilatation. Incidental note of a low insertion of the cystic duct on the common bile duct. No gallbladder wall thickening or pericholecystic fluid. Pancreas: No mass, inflammatory changes, or other parenchymal abnormality identified. No pancreatic ductal dilatation. Spleen:  Within normal limits in size and appearance. Adrenals/Urinary Tract: No masses identified. No evidence of hydronephrosis. Stomach/Bowel: Visualized portions within the abdomen are unremarkable. Vascular/Lymphatic: No pathologically enlarged lymph nodes identified. No abdominal aortic aneurysm demonstrated. Other:  None. Musculoskeletal: No suspicious bone lesions identified. IMPRESSION: Numerous small gallstones in the gallbladder, including tiny stones in or near the gallbladder neck. No choledocholithiasis or biliary ductal dilatation. No gallbladder wall thickening or pericholecystic fluid. Electronically Signed   By: Lauralyn Primes M.D.   On: 10/09/2020 12:43   DG CHEST PORT 1 VIEW  Result Date: 10/10/2020 CLINICAL DATA:  COVID. EXAM: PORTABLE CHEST 1 VIEW COMPARISON:  None. FINDINGS: Generous heart size accentuated by lower lung volumes. Fullness in the right paratracheal region which may be technical. Symmetric hila. There is no edema, consolidation, effusion, or pneumothorax. IMPRESSION: 1. No evidence of pneumonia. 2. Generous heart size and right paratracheal stripe, recommend two-view radiograph after recovery. Electronically Signed   By: Marnee Spring M.D.   On: 10/10/2020 04:47   MR ABDOMEN MRCP W WO CONTAST  Result Date: 10/09/2020 CLINICAL DATA:  Right upper quadrant pain EXAM: MRI ABDOMEN WITHOUT AND WITH CONTRAST (INCLUDING MRCP) TECHNIQUE: Multiplanar multisequence MR imaging of the abdomen was performed both before and after the administration  of intravenous contrast. Heavily T2-weighted images of the biliary and pancreatic ducts were obtained, and three-dimensional MRCP images were rendered by post processing. CONTRAST:  43mL GADAVIST GADOBUTROL 1 MMOL/ML IV SOLN COMPARISON:  Same-day right upper quadrant ultrasound FINDINGS: Lower chest: No acute findings. Hepatobiliary: No mass or other parenchymal abnormality identified. Numerous small gallstones in the gallbladder. Tiny stones in or near the gallbladder neck (series 9, image 20). No choledocholithiasis. No biliary ductal dilatation. Incidental note of a low insertion of the cystic duct on the common bile duct. No gallbladder wall thickening or pericholecystic fluid. Pancreas: No mass, inflammatory changes, or other parenchymal abnormality identified. No pancreatic ductal dilatation. Spleen:  Within normal limits in size and appearance. Adrenals/Urinary Tract: No masses identified. No evidence of hydronephrosis. Stomach/Bowel: Visualized portions within the abdomen are unremarkable. Vascular/Lymphatic: No pathologically enlarged lymph nodes identified. No abdominal aortic aneurysm demonstrated. Other:  None. Musculoskeletal: No suspicious bone lesions identified. IMPRESSION: Numerous small gallstones in the gallbladder, including tiny stones in or near the gallbladder neck. No choledocholithiasis or biliary ductal dilatation. No gallbladder wall thickening or pericholecystic fluid. Electronically Signed   By: Lauralyn Primes M.D.   On: 10/09/2020 12:43   US Abdomen Limited RUQ (LIVER/GB)  Result Date: 10/09/2020 CLINICAL DATA:  Right upper quadrant abdominal pain. EXAM: ULTRASOUND ABDOMEN LIMITED RIGHT UPPER QUADRANT COMPARISON:  None. FINDINGS: Gallbladder:  Multiple shadowing mobile gallstones are present. Mobile sludge is present. Gallbladder wall thickness is within normal limits at 2.2 mm. No sonographic Eulah PontMurphy sign is reported. Common bile duct: Diameter: 6 mm, upper limits of normal. Liver: No  focal lesion identified. Within normal limits in parenchymal echogenicity. Portal vein is patent on color Doppler imaging with normal direction of blood flow towards the liver. Other: None. IMPRESSION: 1. Cholelithiasis without evidence for acute cholecystitis. Electronically Signed   By: Marin Robertshristopher  Mattern M.D.   On: 10/09/2020 02:35    DISCHARGE EXAMINATION: See progress note from earlier today  DISPOSITION: Home  Discharge Instructions    Call MD for:  difficulty breathing, headache or visual disturbances   Complete by: As directed    Call MD for:  extreme fatigue   Complete by: As directed    Call MD for:  hives   Complete by: As directed    Call MD for:  persistant dizziness or light-headedness   Complete by: As directed    Call MD for:  persistant nausea and vomiting   Complete by: As directed    Call MD for:  severe uncontrolled pain   Complete by: As directed    Call MD for:  temperature >100.4   Complete by: As directed    Discharge instructions   Complete by: As directed    1. Please take your medications as prescribed. 2. Seek attention immediately if symptoms recur. 3. Due to positive Covid 19, you will need to stay isolated till 10/19/2020. 4. Once your gallbladder issues have resolved you will need to start taking iron tablets. 5. Be sure to follow up with the surgeon as scheduled.  6. Eat a bland diet for now.    You were cared for by a hospitalist during your hospital stay. If you have any questions about your discharge medications or the care you received while you were in the hospital after you are discharged, you can call the unit and asked to speak with the hospitalist on call if the hospitalist that took care of you is not available. Once you are discharged, your primary care physician will handle any further medical issues. Please note that NO REFILLS for any discharge medications will be authorized once you are discharged, as it is imperative that you return  to your primary care physician (or establish a relationship with a primary care physician if you do not have one) for your aftercare needs so that they can reassess your need for medications and monitor your lab values. If you do not have a primary care physician, you can call [phone] for a physician referral.   Increase activity slowly   Complete by: As directed          Allergies as of 10/10/2020      Reactions   Oseltamivir Hives, Rash   Prednisone Hives      Medication List    TAKE these medications   albuterol 108 (90 Base) MCG/ACT inhaler Commonly known as: VENTOLIN HFA Inhale 1-2 puffs into the lungs every 6 (six) hours as needed for wheezing or shortness of breath.   ibuprofen 200 MG tablet Commonly known as: ADVIL Take 400 mg by mouth every 6 (six) hours as needed for mild pain.   NIFEdipine 90 MG 24 hr tablet Commonly known as: PROCARDIA XL/NIFEDICAL-XL Take 90 mg by mouth daily.   omeprazole 40 MG capsule Commonly known as: PRILOSEC Take 40 mg by mouth daily.   ondansetron 4 MG tablet Commonly  known as: Zofran Take 1 tablet (4 mg total) by mouth every 8 (eight) hours as needed for nausea or vomiting.   oxyCODONE 5 MG immediate release tablet Commonly known as: Oxy IR/ROXICODONE Take 1 tablet (5 mg total) by mouth every 6 (six) hours as needed for severe pain.   Potassium Chloride ER 20 MEQ Tbcr Take 20 mEq by mouth daily for 5 days.   Vitamin D (Ergocalciferol) 1.25 MG (50000 UNIT) Caps capsule Commonly known as: DRISDOL Take 50,000 Units by mouth once a week.         Follow-up Information    Gaynelle Adu, MD. Go on 11/13/2020.   Specialty: General Surgery Why: Follow up appointment to discuss elective cholecystectomy scheduled for 2:15 PM. Please arrive 30 min prior to appointment time. Bring photo ID and insurance information.  Contact information: [address] [place] [address] [phone]               TOTAL  DISCHARGE TIME: 35 minutes  Shaliah Wann [employer] on [email or web address]  10/11/2020, 12:15 PM

## 2020-10-10 NOTE — Progress Notes (Addendum)
Patient ID: Maria Fields, female   DOB: 1991-02-06, 29 y.o.   MRN: [id number]   Acute Care Surgery Service Progress Note:    Chief Complaint/Subjective: No abdominal pain No n/v No sob  Objective: Vital signs in last 24 hours: Temp:  [98.4 F (36.9 C)-98.5 F (36.9 C)] 98.4 F (36.9 C) (12/24 0739) Pulse Rate:  [85-134] 94 (12/24 0739) Resp:  [11-31] 12 (12/24 0739) BP: (107-153)/(72-122) 153/122 (12/24 0739) SpO2:  [96 %-100 %] 99 % (12/24 0739)    Intake/Output from previous day: 12/23 0701 - 12/24 0700 In: 1843.5 [I.V.:1519.6; IV Piggyback:323.9] Out: -  Intake/Output this shift: No intake/output data recorded.  Lungs:  nonlabored  Cardiovascular: reg  Abd: soft, obese, nontender  Extremities: no edema, +SCDs  Neuro: alert, nonfocal  Lab Results: CBC  Recent Labs    10/09/20 0436 10/10/20 0416  WBC 4.4 2.5*  HGB 10.9* 10.1*  HCT 35.0* 33.0*  PLT 309 277   BMET Recent Labs    10/09/20 0436 10/09/20 1642 10/10/20 0416  NA 140  --  139  K 2.6* 3.7 3.4*  CL 102  --  105  CO2 26  --  24  GLUCOSE 135*  --  86  BUN 7  --  <5*  CREATININE 0.64  --  0.70  CALCIUM 8.4*  --  8.0*   LFT Hepatic Function Latest Ref Rng & Units 10/10/2020 10/09/2020 10/08/2020  Total Protein 6.5 - 8.1 g/dL 6.5 7.7 8.1  Albumin 3.5 - 5.0 g/dL 3.0(L) 3.5 3.9  AST 15 - 41 U/L 258(H) 545(H) 732(H)  ALT 0 - 44 U/L 280(H) 344(H) 340(H)  Alk Phosphatase 38 - 126 U/L 202(H) 220(H) 232(H)  Total Bilirubin 0.3 - 1.2 mg/dL 2.9(H) 3.6(H) 2.8(H)   PT/INR No results for input(s): LABPROT, INR in the last 72 hours. ABG No results for input(s): PHART, HCO3 in the last 72 hours.  Invalid input(s): PCO2, PO2  Studies/Results:  Anti-infectives: Anti-infectives (From admission, onward)   None      Medications: Scheduled Meds: . NIFEdipine  90 mg Oral Daily  . potassium chloride  40 mEq Oral Once   Continuous Infusions: . 0.9 % NaCl with KCl 20 mEq / L 100 mL/hr at  10/09/20 2320   PRN Meds:.hydrALAZINE, ibuprofen, ondansetron **OR** ondansetron (ZOFRAN) IV  Assessment/Plan: Patient Active Problem List   Diagnosis Date Noted  . Choledocholithiasis 10/09/2020  . Essential hypertension 10/09/2020  . Obesity, Class III, BMI 40-49.9 (morbid obesity) ([place]) 10/09/2020  . GERD (gastroesophageal reflux disease) 10/09/2020  . Sinus tachycardia 10/09/2020  . Hypokalemia due to excessive gastrointestinal loss of potassium 10/09/2020  . Elevated LFTs 10/09/2020  . RUQ abdominal pain 10/09/2020  HTN severe Obesity Asthma Hx of dermatofibroma of left shoulder - s/p excision 06/08/2019, follows at [facility] positive Hypokalemia - K 2.6-->3.4,  replacement per primary   Symptomatic cholelithiasis Elevated LFTs with hyperbilirubinemia   No pain. No fever. LFTs continuing to trend down No evidence of cholecystitis Will give FLD this am If tolerates can be discharged from gallbladder standpoint Consider giving a Rx for a few pain pills given likelihood to have additional attacks Plan outpt with me at [facility] - on chart [place] diet in interim rec covid education precautions given to pt  Disposition:  LOS: 1 day    Leighton Ruff. Redmond Pulling, MD, FACS General, Bariatric, & Minimally Invasive Surgery [phone] [facility]

## 2020-10-10 NOTE — Progress Notes (Signed)
[facility] PROGRESS NOTE   Maria Fields FGH:[id number] DOB: December 09, 1990 DOA: 10/09/2020  PCP: Patient, No Pcp Per  Brief History/Interval Summary: 29 y.o. 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  The pain was in the right upper quadrant.  Ultrasound revealed cholelithiasis with upper limit normal CBD.  Patient was hospitalized for further management.  Found to be incidentally positive for COVID-19.  Reason for Visit: Abdominal pain  Consultants: None yet  Procedures: None yet  Antibiotics: Anti-infectives (From admission, onward)   None      Subjective/Interval History: Patient mentions that her abdominal pain has improved.  Still has discomfort in the upper abdomen.  Some nausea but no vomiting.      Assessment/Plan:  Symptomatic cholelithiasis Patient presented with abdominal pain and elevated transaminases along with elevated alkaline phosphatase and bilirubin.  She underwent MRCP which did not show any choledocholithiasis.  Stone was noted in the gallbladder neck.  General surgery was subsequently consulted.   They have advanced her to a full liquid diet.  These show improvement this morning.  Bilirubin is also better.  Since patient is positive for COVID-19 and ideally they would like to wait till COVID-19 has completely resolved before surgery is offered.  Since patient symptoms appear to be improving this may be a reasonable plan.  If patient is able to tolerate her diet today without any nausea vomiting and without significant worsening in pain she could be discharged later today.    Hepatitis panel was unremarkable.    Incidental positive COVID-19 test Noted to be positive for COVID-19.  Patient does not have any respiratory symptoms.  Chest x-ray does not show any acute findings.  She saturating normal on room air.  Due to her comorbidities monoclonal antibody was offered and she is agreeable to the same.  This has been ordered and infused.  Leukopenia is most likely due to viral infection.  History of essential hypertension Continue nifedipine.  Elevated blood pressure likely due to pain.    Sinus tachycardia Most likely secondary to pain.  Appears to have improved.  Hypokalemia Be repleted.  Will be discharged on potassium tablets.    Microcytic anemia/iron deficiency Drop in hemoglobin is likely dilutional.  Noted to be stable this morning.  Anemia panel shows that her ferritin is noted to be on the lower side.  TIBC is elevated.  Folate and B12 levels are normal.  May benefit from iron supplementation.  Can be prescribed once she is over her acute issues.  Morbid obesity Estimated body mass index is 41 kg/m as calculated from the following:   Height as of this encounter: 5\' 1"  (1.549 m).   Weight as of this encounter: 98.4 kg.   DVT Prophylaxis: Lovenox will be initiated tonight if she is still here in the hospital Code Status: Full code Family Communication: No family at bedside Disposition Plan: Hopefully return home when improved  Status is: Inpatient  Remains inpatient appropriate because:Ongoing active pain requiring inpatient pain management   Dispo:  Patient From: Home  Planned Disposition: Home  Expected discharge date: 10/11/2020  Medically stable for discharge: No        Medications:  Scheduled: .  enoxaparin (LOVENOX) injection  40 mg Subcutaneous QHS  . NIFEdipine  90 mg Oral Daily   Continuous: . sodium chloride    . 0.9 % NaCl with KCl 20 mEq / L 100 mL/hr at 10/10/20 1049  . famotidine (PEPCID) IV     VPX:TGGYIR  chloride, albuterol, diphenhydrAMINE, EPINEPHrine, famotidine (PEPCID) IV, hydrALAZINE, ondansetron **OR** ondansetron (ZOFRAN) IV, oxyCODONE   Objective:  Vital Signs  Vitals:   10/10/20 0739 10/10/20 0745 10/10/20 0905 10/10/20 1312  BP: (!) 153/122 (!) 154/99 (!) 156/122 (!) 147/112  Pulse: 94 82 93 86  Resp: 12 16 20 20   Temp: 98.4 F (36.9 C)  98.5 F (36.9 C) 99.1 F (37.3 C)  TempSrc: Oral  Oral Oral  SpO2: 99% 100% 100% 100%  Weight:      Height:        Intake/Output Summary (Last 24 hours) at 10/10/2020 1402 Last data filed at 10/10/2020 1300 Gross per 24 hour  Intake 1608.73 ml  Output --  Net 1608.73 ml   Filed Weights   10/08/20 2158  Weight: 98.4 kg    General appearance: Awake alert.  In no distress Resp: Clear to auscultation bilaterally.  Normal effort Cardio: S1-S2 is normal regular.  No S3-S4.  No rubs murmurs or bruit GI: Abdomen is soft.  Tender in the epigastrium and right upper quadrant.  No rebound rigidity or guarding.  No masses organomegaly.  Extremities: No edema.  Full range of motion of lower extremities. Neurologic: Alert and oriented x3.  No focal neurological deficits.    Lab Results:  Data Reviewed: I have personally reviewed following labs and imaging studies  CBC: Recent Labs  Lab 10/08/20 2214 10/09/20 0436 10/10/20 0416  WBC 3.7* 4.4 2.5*  HGB 12.0 10.9* 10.1*  HCT 38.7 35.0* 33.0*  MCV 74.7* 74.8* 75.3*  PLT 371 309 277    Basic Metabolic Panel: Recent Labs  Lab 10/08/20 2214 10/09/20 0436 10/09/20 1642 10/10/20 0416  NA 139 140  --  139  K 3.0* 2.6* 3.7 3.4*  CL 100 102  --  105  CO2 27 26  --  24  GLUCOSE 107* 135*  --  86  BUN 9 7  --  <5*  CREATININE 0.73 0.64  --  0.70  CALCIUM 9.0 8.4*  --  8.0*  MG  --  1.7  --   --     GFR: Estimated Creatinine Clearance: 113.4 mL/min (by C-G formula based on SCr of 0.7 mg/dL).  Liver Function Tests: Recent Labs  Lab 10/08/20 2214 10/09/20 0436  10/10/20 0416  AST 732* 545* 258*  ALT 340* 344* 280*  ALKPHOS 232* 220* 202*  BILITOT 2.8* 3.6* 2.9*  PROT 8.1 7.7 6.5  ALBUMIN 3.9 3.5 3.0*    Recent Labs  Lab 10/08/20 2214  LIPASE 26    Thyroid Function Tests: Recent Labs    10/09/20 0436  TSH 0.766      Recent Results (from the past 240 hour(s))  Resp Panel by RT-PCR (Flu A&B, Covid)     Status: Abnormal   Collection Time: 10/09/20  6:20 AM  Result Value Ref Range Status   SARS Coronavirus 2 by RT PCR POSITIVE (A) NEGATIVE Final    Comment: RESULT CALLED TO, READ BACK BY AND VERIFIED WITH: JACOBS,D. RN AT 10/11/20 10/09/20 MULLINS,T (NOTE) SARS-CoV-2 target nucleic acids are DETECTED.  The SARS-CoV-2 RNA is generally detectable in upper respiratory specimens during the acute phase of infection. Positive results  are indicative of the presence of the identified virus, but do not rule out bacterial infection or co-infection with other pathogens not detected by the test. Clinical correlation with patient history and other diagnostic information is necessary to determine patient infection status. The expected result is Negative.  Fact Sheet for Patients: BloggerCourse.comhttps://www.fda.gov/media/152166/download  Fact Sheet for Healthcare Providers: SeriousBroker.ithttps://www.fda.gov/media/152162/download  This test is not yet approved or cleared by the [place] FDA and  has been authorized for detection and/or diagnosis of SARS-CoV-2 by FDA under an Emergency Use Authorization (EUA).  This EUA will remain in effect (meaning this test  can be used) for the duration of  the COVID-19 declaration under Section 564(b)(1) of the Act, 21 U.S.C. section 360bbb-3(b)(1), unless the authorization is terminated or revoked sooner.     Influenza A by PCR NEGATIVE NEGATIVE Final   Influenza B by PCR NEGATIVE NEGATIVE Final    Comment: (NOTE) The Xpert Xpress SARS-CoV-2/FLU/RSV plus assay is intended as an aid in the diagnosis of influenza from  Nasopharyngeal swab specimens and should not be used as a sole basis for treatment. Nasal washings and aspirates are unacceptable for Xpert Xpress SARS-CoV-2/FLU/RSV testing.  Fact Sheet for Patients: BloggerCourse.comhttps://www.fda.gov/media/152166/download  Fact Sheet for Healthcare Providers: SeriousBroker.ithttps://www.fda.gov/media/152162/download  This test is not yet approved or cleared by the [place] FDA and has been authorized for detection and/or diagnosis of SARS-CoV-2 by FDA under an Emergency Use Authorization (EUA). This EUA will remain in effect (meaning this test can be used) for the duration of the COVID-19 declaration under Section 564(b)(1) of the Act, 21 U.S.C. section 360bbb-3(b)(1), unless the authorization is terminated or revoked.  Performed at [facility], [address] [place], [place] [address]       Radiology Studies: MR 3D Recon At Scanner  Result Date: 10/09/2020 CLINICAL DATA:  Right upper quadrant pain EXAM: MRI ABDOMEN WITHOUT AND WITH CONTRAST (INCLUDING MRCP) TECHNIQUE: Multiplanar multisequence MR imaging of the abdomen was performed both before and after the administration of intravenous contrast. Heavily T2-weighted images of the biliary and pancreatic ducts were obtained, and three-dimensional MRCP images were rendered by post processing. CONTRAST:  10mL GADAVIST GADOBUTROL 1 MMOL/ML IV SOLN COMPARISON:  Same-day right upper quadrant ultrasound FINDINGS: Lower chest: No acute findings. Hepatobiliary: No mass or other parenchymal abnormality identified. Numerous small gallstones in the gallbladder. Tiny stones in or near the gallbladder neck (series 9, image 20). No choledocholithiasis. No biliary ductal dilatation. Incidental note of a low insertion of the cystic duct on the common bile duct. No gallbladder wall thickening or pericholecystic fluid. Pancreas: No mass, inflammatory changes, or other parenchymal abnormality identified. No pancreatic ductal  dilatation. Spleen:  Within normal limits in size and appearance. Adrenals/Urinary Tract: No masses identified. No evidence of hydronephrosis. Stomach/Bowel: Visualized portions within the abdomen are unremarkable. Vascular/Lymphatic: No pathologically enlarged lymph nodes identified. No abdominal aortic aneurysm demonstrated. Other:  None. Musculoskeletal: No suspicious bone lesions identified. IMPRESSION: Numerous small gallstones in the gallbladder, including tiny stones in or near the gallbladder neck. No choledocholithiasis or biliary ductal dilatation. No gallbladder wall thickening or pericholecystic fluid. Electronically Signed   By: Lauralyn PrimesAlex  Bibbey M.D.   On: 10/09/2020 12:43   DG CHEST PORT 1 VIEW  Result Date: 10/10/2020 CLINICAL DATA:  COVID. EXAM: PORTABLE CHEST 1 VIEW COMPARISON:  None. FINDINGS: Generous heart size accentuated by lower lung volumes. Fullness in the right paratracheal region which may be technical. Symmetric hila. There is no edema, consolidation, effusion, or  pneumothorax. IMPRESSION: 1. No evidence of pneumonia. 2. Generous heart size and right paratracheal stripe, recommend two-view radiograph after recovery. Electronically Signed   By: Marnee Spring M.D.   On: 10/10/2020 04:47   MR ABDOMEN MRCP W WO CONTAST  Result Date: 10/09/2020 CLINICAL DATA:  Right upper quadrant pain EXAM: MRI ABDOMEN WITHOUT AND WITH CONTRAST (INCLUDING MRCP) TECHNIQUE: Multiplanar multisequence MR imaging of the abdomen was performed both before and after the administration of intravenous contrast. Heavily T2-weighted images of the biliary and pancreatic ducts were obtained, and three-dimensional MRCP images were rendered by post processing. CONTRAST:  75mL GADAVIST GADOBUTROL 1 MMOL/ML IV SOLN COMPARISON:  Same-day right upper quadrant ultrasound FINDINGS: Lower chest: No acute findings. Hepatobiliary: No mass or other parenchymal abnormality identified. Numerous small gallstones in the  gallbladder. Tiny stones in or near the gallbladder neck (series 9, image 20). No choledocholithiasis. No biliary ductal dilatation. Incidental note of a low insertion of the cystic duct on the common bile duct. No gallbladder wall thickening or pericholecystic fluid. Pancreas: No mass, inflammatory changes, or other parenchymal abnormality identified. No pancreatic ductal dilatation. Spleen:  Within normal limits in size and appearance. Adrenals/Urinary Tract: No masses identified. No evidence of hydronephrosis. Stomach/Bowel: Visualized portions within the abdomen are unremarkable. Vascular/Lymphatic: No pathologically enlarged lymph nodes identified. No abdominal aortic aneurysm demonstrated. Other:  None. Musculoskeletal: No suspicious bone lesions identified. IMPRESSION: Numerous small gallstones in the gallbladder, including tiny stones in or near the gallbladder neck. No choledocholithiasis or biliary ductal dilatation. No gallbladder wall thickening or pericholecystic fluid. Electronically Signed   By: Lauralyn Primes M.D.   On: 10/09/2020 12:43   US Abdomen Limited RUQ (LIVER/GB)  Result Date: 10/09/2020 CLINICAL DATA:  Right upper quadrant abdominal pain. EXAM: ULTRASOUND ABDOMEN LIMITED RIGHT UPPER QUADRANT COMPARISON:  None. FINDINGS: Gallbladder: Multiple shadowing mobile gallstones are present. Mobile sludge is present. Gallbladder wall thickness is within normal limits at 2.2 mm. No sonographic Eulah Pont sign is reported. Common bile duct: Diameter: 6 mm, upper limits of normal. Liver: No focal lesion identified. Within normal limits in parenchymal echogenicity. Portal vein is patent on color Doppler imaging with normal direction of blood flow towards the liver. Other: None. IMPRESSION: 1. Cholelithiasis without evidence for acute cholecystitis. Electronically Signed   By: Marin Roberts M.D.   On: 10/09/2020 02:35       LOS: 1 day   Maria Fields  [facility] Pager on  [email or web address]  10/10/2020, 2:02 PM

## 2020-10-10 NOTE — Discharge Instructions (Signed)
10 Things You Can Do to Manage Your COVID-19 Symptoms at Home If you have possible or confirmed COVID-19: 1. Stay home from work and school. And stay away from other public places. If you must go out, avoid using any kind of public transportation, ridesharing, or taxis. 2. Monitor your symptoms carefully. If your symptoms get worse, call your healthcare provider immediately. 3. Get rest and stay hydrated. 4. If you have a medical appointment, call the healthcare provider ahead of time and tell them that you have or may have COVID-19. 5. For medical emergencies, call 911 and notify the dispatch personnel that you have or may have COVID-19. 6. Cover your cough and sneezes with a tissue or use the inside of your elbow. 7. Wash your hands often with soap and water for at least 20 seconds or clean your hands with an alcohol-based hand sanitizer that contains at least 60% alcohol. 8. As much as possible, stay in a specific room and away from other people in your home. Also, you should use a separate bathroom, if available. If you need to be around other people in or outside of the home, wear a mask. 9. Avoid sharing personal items with other people in your household, like dishes, towels, and bedding. 10. Clean all surfaces that are touched often, like counters, tabletops, and doorknobs. Use household cleaning sprays or wipes according to the label instructions. [employer] 04/18/2019 This information is not intended to replace advice given to you by your health care provider. Make sure you discuss any questions you have with your health care provider. Document Revised: 09/20/2019 Document Reviewed: 09/20/2019 Elsevier Patient Education  2020 [employer]. Cholelithiasis  Cholelithiasis is also called "gallstones." It is a kind of gallbladder disease. The gallbladder is an organ that stores a liquid (bile) that helps you digest fat. Gallstones may not cause symptoms (may be silent gallstones)  until they cause a blockage, and then they can cause pain (gallbladder attack). Follow these instructions at home:  Take over-the-counter and prescription medicines only as told by your doctor.  Stay at a healthy weight.  Eat healthy foods. This includes: ? Eating fewer fatty foods, like fried foods. ? Eating fewer refined carbs (refined carbohydrates). Refined carbs are breads and grains that are highly processed, like white bread and white rice. Instead, choose whole grains like whole-wheat bread and brown rice. ? Eating more fiber. Almonds, fresh fruit, and beans are healthy sources of fiber.  Keep all follow-up visits as told by your doctor. This is important. Contact a doctor if:  You have sudden pain in the upper right side of your belly (abdomen). Pain might spread to your right shoulder or your chest. This may be a sign of a gallbladder attack.  You feel sick to your stomach (are nauseous).  You throw up (vomit).  You have been diagnosed with gallstones that have no symptoms and you get: ? Belly pain. ? Discomfort, burning, or fullness in the upper part of your belly (indigestion). Get help right away if:  You have sudden pain in the upper right side of your belly, and it lasts for more than 2 hours.  You have belly pain that lasts for more than 5 hours.  You have a fever or chills.  You keep feeling sick to your stomach or you keep throwing up.  Your skin or the whites of your eyes turn yellow (jaundice).  You have dark-colored pee (urine).  You have light-colored poop (stool). Summary  Cholelithiasis is  also called "gallstones."  The gallbladder is an organ that stores a liquid (bile) that helps you digest fat.  Silent gallstones are gallstones that do not cause symptoms.  A gallbladder attack may cause sudden pain in the upper right side of your belly. Pain might spread to your right shoulder or your chest. If this happens, contact your doctor.  If you have  sudden pain in the upper right side of your belly that lasts for more than 2 hours, get help right away. This information is not intended to replace advice given to you by your health care provider. Make sure you discuss any questions you have with your health care provider. Document Revised: 09/16/2017 Document Reviewed: 06/20/2016 Elsevier Patient Education  2020 Elsevier Inc.   Gallbladder Eating Plan If you have a gallbladder condition, you may have trouble digesting fats. Eating a low-fat diet can help reduce your symptoms, and may be helpful before and after having surgery to remove your gallbladder (cholecystectomy). Your health care provider may recommend that you work with a diet and nutrition specialist (dietitian) to help you reduce the amount of fat in your diet. What are tips for following this plan? General guidelines  Limit your fat intake to less than 30% of your total daily calories. If you eat around 1,800 calories each day, this is less than 60 grams (g) of fat per day.  Fat is an important part of a healthy diet. Eating a low-fat diet can make it hard to maintain a healthy body weight. Ask your dietitian how much fat, calories, and other nutrients you need each day.  Eat small, frequent meals throughout the day instead of three large meals.  Drink at least 8-10 cups of fluid a day. Drink enough fluid to keep your urine clear or pale yellow.  Limit alcohol intake to no more than 1 drink a day for nonpregnant women and 2 drinks a day for men. One drink equals 12 oz of beer, 5 oz of wine, or 1 oz of hard liquor. Reading food labels  Check Nutrition Facts on food labels for the amount of fat per serving. Choose foods with less than 3 grams of fat per serving. Shopping  Choose nonfat and low-fat healthy foods. Look for the words "nonfat," "low fat," or "fat free."  Avoid buying processed or prepackaged foods. Cooking  Cook using low-fat methods, such as baking,  broiling, grilling, or boiling.  Cook with small amounts of healthy fats, such as olive oil, grapeseed oil, canola oil, or sunflower oil. What foods are recommended?   All fresh, frozen, or canned fruits and vegetables.  Whole grains.  Low-fat or non-fat (skim) milk and yogurt.  Lean meat, skinless poultry, fish, eggs, and beans.  Low-fat protein supplement powders or drinks.  Spices and herbs. What foods are not recommended?  High-fat foods. These include baked goods, fast food, fatty cuts of meat, ice cream, french toast, sweet rolls, pizza, cheese bread, foods covered with butter, creamy sauces, or cheese.  Fried foods. These include french fries, tempura, battered fish, breaded chicken, fried breads, and sweets.  Foods with strong odors.  Foods that cause bloating and gas. Summary  A low-fat diet can be helpful if you have a gallbladder condition, or before and after gallbladder surgery.  Limit your fat intake to less than 30% of your total daily calories. This is about 60 g of fat if you eat 1,800 calories each day.  Eat small, frequent meals throughout the day instead  of three large meals. This information is not intended to replace advice given to you by your health care provider. Make sure you discuss any questions you have with your health care provider. Document Revised: 01/25/2019 Document Reviewed: 11/11/2016 Elsevier Patient Education  2020 [employer]. What types of side effects do monoclonal antibody drugs cause?  Common side effects  In general, the more common side effects caused by monoclonal antibody drugs include: . Allergic reactions, such as hives or itching . Flu-like signs and symptoms, including chills, fatigue, fever, and muscle aches and pains . Nausea, vomiting . Diarrhea . Skin rashes . Low blood pressure   The CDC is recommending patients who receive monoclonal antibody treatments wait at least 90 days before being  vaccinated.  Currently, there are no data on the safety and efficacy of mRNA COVID-19 vaccines in persons who received monoclonal antibodies or convalescent plasma as part of COVID-19 treatment. Based on the estimated half-life of such therapies as well as evidence suggesting that reinfection is uncommon in the 90 days after initial infection, vaccination should be deferred for at least 90 days, as a precautionary measure until additional information becomes available, to avoid interference of the antibody treatment with vaccine-induced immune responses.

## 2020-10-10 NOTE — Plan of Care (Signed)
  Problem: Pain Managment: Goal: General experience of comfort will improve Outcome: Progressing   Problem: Activity: Goal: Risk for activity intolerance will decrease Outcome: Progressing   Problem: Education: Goal: Knowledge of risk factors and measures for prevention of condition will improve Outcome: Progressing   Problem: Respiratory: Goal: Will maintain a patent airway Outcome: Progressing

## 2020-10-10 NOTE — ED Notes (Signed)
BP elevated, trending up. Pt with hx of high BP. PRN parameters not met at this time. Message sent to MD.

## 2020-10-10 NOTE — Plan of Care (Signed)
  Problem: Education: Goal: Knowledge of General Education information will improve Description: Including pain rating scale, medication(s)/side effects and non-pharmacologic comfort measures Outcome: Adequate for Discharge   Problem: Health Behavior/Discharge Planning: Goal: Ability to manage health-related needs will improve Outcome: Adequate for Discharge   Problem: Clinical Measurements: Goal: Ability to maintain clinical measurements within normal limits will improve Outcome: Adequate for Discharge Goal: Will remain free from infection Outcome: Adequate for Discharge Goal: Diagnostic test results will improve Outcome: Adequate for Discharge Goal: Respiratory complications will improve Outcome: Adequate for Discharge Goal: Cardiovascular complication will be avoided Outcome: Adequate for Discharge   Problem: Activity: Goal: Risk for activity intolerance will decrease 10/10/2020 1543 by William Dalton, RN Outcome: Adequate for Discharge 10/10/2020 1536 by William Dalton, RN Outcome: Progressing   Problem: Nutrition: Goal: Adequate nutrition will be maintained Outcome: Adequate for Discharge   Problem: Coping: Goal: Level of anxiety will decrease Outcome: Adequate for Discharge   Problem: Elimination: Goal: Will not experience complications related to bowel motility Outcome: Adequate for Discharge Goal: Will not experience complications related to urinary retention Outcome: Adequate for Discharge   Problem: Pain Managment: Goal: General experience of comfort will improve 10/10/2020 1543 by William Dalton, RN Outcome: Adequate for Discharge 10/10/2020 1536 by William Dalton, RN Outcome: Progressing   Problem: Safety: Goal: Ability to remain free from injury will improve 10/10/2020 1543 by William Dalton, RN Outcome: Adequate for Discharge 10/10/2020 1536 by William Dalton, RN Outcome: Progressing   Problem: Skin Integrity: Goal:  Risk for impaired skin integrity will decrease Outcome: Adequate for Discharge   Problem: Education: Goal: Knowledge of risk factors and measures for prevention of condition will improve 10/10/2020 1543 by William Dalton, RN Outcome: Adequate for Discharge 10/10/2020 1536 by William Dalton, RN Outcome: Progressing   Problem: Coping: Goal: Psychosocial and spiritual needs will be supported Outcome: Adequate for Discharge   Problem: Respiratory: Goal: Will maintain a patent airway 10/10/2020 1543 by William Dalton, RN Outcome: Adequate for Discharge 10/10/2020 1536 by William Dalton, RN Outcome: Progressing Goal: Complications related to the disease process, condition or treatment will be avoided or minimized Outcome: Adequate for Discharge

## 2020-10-11 ENCOUNTER — Telehealth: Payer: Self-pay | Admitting: General Surgery

## 2020-10-11 NOTE — Telephone Encounter (Signed)
Pt called with c/o recurrent right sided pain that started around 6 pm. Took oxycodone and zofran. Fell asleep and when woke still had some pain so took another oxycodone and shortly thereafter vomited. Rates pain as 5/10 (improved from earlier in evening). No fever/chills. Suggested take tylenol but doesn't have any. Has ibuprofen at home. Told pt to take ibuprofen - probably having another attack but if pain worsens, develop f/c, persistent n/v - needs to go to ed for evaluation. Told pt she come to [place] or go to local hospital - gallbladder disease not an uncommon surgical problem.  If go to local ED, advised pt to tell them she tested positive for covid this past week and got monoclonal antibody infusion.   Mary Sella. Andrey Campanile, MD, FACS General, Bariatric, & Minimally Invasive Surgery [facility], [place]

## 2020-11-13 ENCOUNTER — Ambulatory Visit: Payer: Self-pay | Admitting: General Surgery

## 2020-11-13 NOTE — H&P (View-Only) (Signed)
Maria Fields Appointment: 11/13/2020 2:15 PM Location: [facility] Patient #: [id number] DOB: 05-10-1991 Single / Language: Maria Fields / Race: Black or African American Female  History of Present Illness Maria Hiss M. Maria Haverstick MD; 11/13/2020 6:02 PM) The patient is a 30 year old female who presents for evaluation of gall stones. She comes in for follow-up after being initially met in the hospital a little over one month ago for symptomatic cholelithiasis. She presented to [facility] on December 23 complaining of right upper quadrant pain, nausea vomiting, chills and diarrhea. The pain radiated to her back. She was also found to be coated positive. She had not been vaccinated. Ultrasound revealed gallstones without any evidence of cholecystitis. Common bile duct was 6 mm and upper limits of normal. She had elevated LFTs. Her bilirubin was initially 2.8 and trended up to 3.6. Transaminases were 732 AST, 340 ALT, 232 alkaline phosphatase. She was admitted and started on medical management for symptomatically cholelithiasis. She was not felt to be a safe operative candidate because of her coated diagnosis and because of her comorbidities. In MRCP was performed which demonstrated no evidence of choledocholithiasis. Her episode resolved and her LFTs were trending down since she was discharged. She presented to the [facility] on December 28 with a normal CBC. However her total bilirubin was 6.8. AST 238, ALT 250, alkaline phosphatase 275. She had an ultrasound there that showed gallstones and borderline wall thickness along with dilated intra-and extrahepatic bile duct. They thought she had had an ERCP done at outside hospital said they thought the dilated bile duct was post ERCP findings. But she had not had an ERCP. She was discharged. She states that she modified her diet significantly after that last episode. She could have fried foods. She has not had any episodes since then. No  fever or chills. No nausea or vomiting. She did have one episode of stomach pain after eating some food that her mother cooked but it was not like the gallbladder pain she has had back in November   Problem List/Past Medical Maria Fields. 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  Diagnostic Studies History Maria Coke, RN; 11/13/2020 3:52 PM) Colonoscopy never Mammogram never Pap Smear 1-5 years ago  Allergies Maria Forehand, CNA; 11/13/2020 3:05 PM) No Known Drug Allergies [11/13/2020]: Allergies Reconciled  Medication History Maria Forehand, CNA; 11/13/2020 3:05 PM) No Current Medications  Social History Maria Coke, RN; 11/13/2020 3:52 PM) Alcohol use Occasional alcohol use. Caffeine use Carbonated beverages, Tea. No drug use Tobacco use Former smoker.  Family History Maria Coke, RN; 11/13/2020 3:52 PM) Alcohol Abuse Father. Breast Cancer Family Members In General. Cancer Mother. Hypertension Father. Respiratory Condition Mother.  Pregnancy / Birth History Maria Coke, RN; 11/13/2020 3:52 PM) Age at menarche 68 years. Contraceptive History Contraceptive implant, Depo-provera, Oral contraceptives. Gravida 5 Irregular periods Length (months) of breastfeeding 3-6 Maternal age 51-25 Para 2  Other Problems Maria Hiss M. Maria Pulling, MD; 11/13/2020 6:03 PM) Back Pain Cholelithiasis Gastroesophageal Reflux Disease Melanoma Migraine Headache Oophorectomy Left. High blood pressure Asthma ASTHMA IN ADULT WITHOUT COMPLICATION, UNSPECIFIED ASTHMA SEVERITY, UNSPECIFIED WHETHER PERSISTENT (J45.909) HYPERTENSION, ESSENTIAL (I10)     Review of Systems (Maria Herrin RN; 11/13/2020 3:52 PM) General Not Present- Appetite Loss,  Chills, Fatigue, Fever, Night Sweats, Weight Gain and Weight Loss. Skin Not Present- Change in  Wart/Mole, Dryness, Hives, Jaundice, New Lesions, Non-Healing Wounds, Rash and Ulcer. HEENT Present- Yellow Eyes. Not Present- Earache, Hearing Loss, Hoarseness, Nose Bleed, Oral Ulcers, Ringing in the Ears, Seasonal Allergies, Sinus Pain, Sore Throat, Visual Disturbances and Wears glasses/contact lenses. Respiratory Present- Snoring. Not Present- Bloody sputum, Chronic Cough, Difficulty Breathing and Wheezing. Breast Not Present- Breast Mass, Breast Pain, Nipple Discharge and Skin Changes. Cardiovascular Present- Swelling of Extremities. Not Present- Chest Pain, Difficulty Breathing Lying Down, Leg Cramps, Palpitations, Rapid Heart Rate and Shortness of Breath. Gastrointestinal Present- Bloating and Nausea. Not Present- Abdominal Pain, Bloody Stool, Change in Bowel Habits, Chronic diarrhea, Constipation, Difficulty Swallowing, Excessive gas, Gets full quickly at meals, Hemorrhoids, Indigestion, Rectal Pain and Vomiting. Female Genitourinary Not Present- Frequency, Nocturia, Painful Urination, Pelvic Pain and Urgency. Musculoskeletal Present- Back Pain. Not Present- Joint Pain, Joint Stiffness, Muscle Pain, Muscle Weakness and Swelling of Extremities. Neurological Not Present- Decreased Memory, Fainting, Headaches, Numbness, Seizures, Tingling, Tremor, Trouble walking and Weakness. Psychiatric Not Present- Anxiety, Bipolar, Change in Sleep Pattern, Depression, Fearful and Frequent crying. Endocrine Not Present- Cold Intolerance, Excessive Hunger, Hair Changes, Heat Intolerance, Hot flashes and New Diabetes. Hematology Not Present- Blood Thinners, Easy Bruising, Excessive bleeding, Gland problems, HIV and Persistent Infections.   Physical Exam Maria Hiss M. Maria Boldon MD; 11/13/2020 5:53 PM)  The physical exam findings are as follows: Note:obese AAF in nad, appears healthy  General Mental  Status-Alert. General Appearance-Consistent with stated age. Hydration-Well hydrated. Voice-Normal.  Head and Neck Head-normocephalic, atraumatic with no lesions or palpable masses. Trachea-midline. Thyroid Gland Characteristics - normal size and consistency.  Eye Eyeball - Bilateral-Extraocular movements intact. Sclera/Conjunctiva - Bilateral-No scleral icterus.  Chest and Lung Exam Chest and lung exam reveals -quiet, even and easy respiratory effort with no use of accessory muscles and on auscultation, normal breath sounds, no adventitious sounds and normal vocal resonance. Inspection Chest Wall - Normal. Back - normal.  Breast - Did not examine.  Cardiovascular Cardiovascular examination reveals -normal heart sounds, regular rate and rhythm with no murmurs and normal pedal pulses bilaterally.  Abdomen Inspection Inspection of the abdomen reveals - No Hernias. Skin - Scar - no surgical scars. Palpation/Percussion Palpation and Percussion of the abdomen reveal - Soft, Non Tender, No Rebound tenderness, No Rigidity (guarding) and No hepatosplenomegaly. Auscultation Auscultation of the abdomen reveals - Bowel sounds normal.  Peripheral Vascular Upper Extremity Palpation - Pulses bilaterally normal.  Neurologic Neurologic evaluation reveals -alert and oriented x 3 with no impairment of recent or remote memory. Mental Status-Normal.  Neuropsychiatric The patient's mood and affect are described as -normal. Judgment and Insight-insight is appropriate concerning matters relevant to self.  Musculoskeletal Normal Exam - Left-Upper Extremity Strength Normal and Lower Extremity Strength Normal. Normal Exam - Right-Upper Extremity Strength Normal and Lower Extremity Strength Normal.  Lymphatic Head & Neck  General Head & Neck Lymphatics: Bilateral - Description - Normal. Axillary - Did not examine. Femoral & Inguinal - Did not  examine.    Assessment & Plan Maria Hiss M. Londan Coplen MD; 11/13/2020 6:02 PM)  SYMPTOMATIC CHOLELITHIASIS (K80.20) Impression: I believe the patient's symptoms are consistent with gallbladder disease. we had discussed gallbladder disease when we met in the hospital in December  We rediscussed gallbladder disease. The patient was given Neurosurgeon. We discussed non-operative and operative management. We discussed the signs & symptoms of acute cholecystitis  I discussed laparoscopic cholecystectomy with IOC in detail. The patient was given educational material as well as diagrams detailing the procedure. We discussed the risks  and benefits of a laparoscopic cholecystectomy including, but not limited to bleeding, infection, injury to surrounding structures such as the intestine or liver, bile leak, retained gallstones, need to convert to an open procedure, prolonged diarrhea, blood clots such as DVT, common bile duct injury, anesthesia risks, and possible need for additional procedures. We discussed the typical post-operative recovery course. I explained that the likelihood of improvement of their symptoms is good.  The patient has elected to proceed with surgery.  This patient encounter took 35 minutes today to perform the following: take history, perform exam, review outside records, interpret imaging, counsel the patient on their diagnosis and document encounter, findings & plan in the EHR  Current Plans Pt Education - Pamphlet Given - Laparoscopic Gallbladder Surgery: discussed with patient and provided information. You are being scheduled for surgery- Our schedulers will call you.  You should hear from our office's scheduling department within 5 working days about the location, date, and time of surgery. We try to make accommodations for patient's preferences in scheduling surgery, but sometimes the OR schedule or the surgeon's schedule prevents [place] from making those accommodations.  If  you have not heard from our office [phone]) in 5 working days, call the office and ask for your surgeon's nurse.  If you have other questions about your diagnosis, plan, or surgery, call the office and ask for your surgeon's nurse.   ELEVATED LFTS (R79.89)   DILATED BILE DUCT (K83.8) Impression: On an ultrasound at [facility] on December 28 she had a dilated intra-and extrahepatic bile ducts. Her LFTs were also higher than when she was at [facility]. We discussed the possibility that she may have a common bile duct stone. We also discussed the possibility that she may need perioperative ERCP. Right now she looks clinically well. She has not had an episode since late December. She is not tachycardic. There is no evidence of scleral icterus. Therefore I do not think she needs to be emergently done. However I would like to get her on the surgery schedule as quickly as possible. We will check her labs preoperatively. If her bilirubin is still significantly elevated above 4, then we may need to do preoperative ERCP.   HYPERTENSION, ESSENTIAL (I10)   ASTHMA IN ADULT WITHOUT COMPLICATION, UNSPECIFIED ASTHMA SEVERITY, UNSPECIFIED WHETHER PERSISTENT (J45.909)   HISTORY OF COVID-19 (U79.98) Impression: 10/09/20  Maria Fields. Maria Pulling, MD, FACS General, Bariatric, & Minimally Invasive Surgery [facility], [place]

## 2020-11-13 NOTE — H&P (Signed)
Maria Fields Appointment: 11/13/2020 2:15 PM Location: [facility] Patient #: [id number] DOB: 05-10-1991 Single / Language: Maria Fields / Race: Black or African American Female  History of Present Illness Maria Hiss M. Tymir Terral MD; 11/13/2020 6:02 PM) The patient is a 30 year old female who presents for evaluation of gall stones. She comes in for follow-up after being initially met in the hospital a little over one month ago for symptomatic cholelithiasis. She presented to [facility] on December 23 complaining of right upper quadrant pain, nausea vomiting, chills and diarrhea. The pain radiated to her back. She was also found to be coated positive. She had not been vaccinated. Ultrasound revealed gallstones without any evidence of cholecystitis. Common bile duct was 6 mm and upper limits of normal. She had elevated LFTs. Her bilirubin was initially 2.8 and trended up to 3.6. Transaminases were 732 AST, 340 ALT, 232 alkaline phosphatase. She was admitted and started on medical management for symptomatically cholelithiasis. She was not felt to be a safe operative candidate because of her coated diagnosis and because of her comorbidities. In MRCP was performed which demonstrated no evidence of choledocholithiasis. Her episode resolved and her LFTs were trending down since she was discharged. She presented to the [facility] on December 28 with a normal CBC. However her total bilirubin was 6.8. AST 238, ALT 250, alkaline phosphatase 275. She had an ultrasound there that showed gallstones and borderline wall thickness along with dilated intra-and extrahepatic bile duct. They thought she had had an ERCP done at outside hospital said they thought the dilated bile duct was post ERCP findings. But she had not had an ERCP. She was discharged. She states that she modified her diet significantly after that last episode. She could have fried foods. She has not had any episodes since then. No  fever or chills. No nausea or vomiting. She did have one episode of stomach pain after eating some food that her mother cooked but it was not like the gallbladder pain she has had back in November   Problem List/Past Medical Maria Fields. 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  Diagnostic Studies History Maria Coke, RN; 11/13/2020 3:52 PM) Colonoscopy never Mammogram never Pap Smear 1-5 years ago  Allergies Maria Forehand, CNA; 11/13/2020 3:05 PM) No Known Drug Allergies [11/13/2020]: Allergies Reconciled  Medication History Maria Forehand, CNA; 11/13/2020 3:05 PM) No Current Medications  Social History Maria Coke, RN; 11/13/2020 3:52 PM) Alcohol use Occasional alcohol use. Caffeine use Carbonated beverages, Tea. No drug use Tobacco use Former smoker.  Family History Maria Coke, RN; 11/13/2020 3:52 PM) Alcohol Abuse Father. Breast Cancer Family Members In General. Cancer Mother. Hypertension Father. Respiratory Condition Mother.  Pregnancy / Birth History Maria Coke, RN; 11/13/2020 3:52 PM) Age at menarche 68 years. Contraceptive History Contraceptive implant, Depo-provera, Oral contraceptives. Gravida 5 Irregular periods Length (months) of breastfeeding 3-6 Maternal age 51-25 Para 2  Other Problems Maria Hiss M. Redmond Pulling, MD; 11/13/2020 6:03 PM) Back Pain Cholelithiasis Gastroesophageal Reflux Disease Melanoma Migraine Headache Oophorectomy Left. High blood pressure Asthma ASTHMA IN ADULT WITHOUT COMPLICATION, UNSPECIFIED ASTHMA SEVERITY, UNSPECIFIED WHETHER PERSISTENT (J45.909) HYPERTENSION, ESSENTIAL (I10)     Review of Systems (Maria Herrin RN; 11/13/2020 3:52 PM) General Not Present- Appetite Loss,  Chills, Fatigue, Fever, Night Sweats, Weight Gain and Weight Loss. Skin Not Present- Change in  Wart/Mole, Dryness, Hives, Jaundice, New Lesions, Non-Healing Wounds, Rash and Ulcer. HEENT Present- Yellow Eyes. Not Present- Earache, Hearing Loss, Hoarseness, Nose Bleed, Oral Ulcers, Ringing in the Ears, Seasonal Allergies, Sinus Pain, Sore Throat, Visual Disturbances and Wears glasses/contact lenses. Respiratory Present- Snoring. Not Present- Bloody sputum, Chronic Cough, Difficulty Breathing and Wheezing. Breast Not Present- Breast Mass, Breast Pain, Nipple Discharge and Skin Changes. Cardiovascular Present- Swelling of Extremities. Not Present- Chest Pain, Difficulty Breathing Lying Down, Leg Cramps, Palpitations, Rapid Heart Rate and Shortness of Breath. Gastrointestinal Present- Bloating and Nausea. Not Present- Abdominal Pain, Bloody Stool, Change in Bowel Habits, Chronic diarrhea, Constipation, Difficulty Swallowing, Excessive gas, Gets full quickly at meals, Hemorrhoids, Indigestion, Rectal Pain and Vomiting. Female Genitourinary Not Present- Frequency, Nocturia, Painful Urination, Pelvic Pain and Urgency. Musculoskeletal Present- Back Pain. Not Present- Joint Pain, Joint Stiffness, Muscle Pain, Muscle Weakness and Swelling of Extremities. Neurological Not Present- Decreased Memory, Fainting, Headaches, Numbness, Seizures, Tingling, Tremor, Trouble walking and Weakness. Psychiatric Not Present- Anxiety, Bipolar, Change in Sleep Pattern, Depression, Fearful and Frequent crying. Endocrine Not Present- Cold Intolerance, Excessive Hunger, Hair Changes, Heat Intolerance, Hot flashes and New Diabetes. Hematology Not Present- Blood Thinners, Easy Bruising, Excessive bleeding, Gland problems, HIV and Persistent Infections.   Physical Exam Maria Areola M. Maria Granados MD; 11/13/2020 5:53 PM)  The physical exam findings are as follows: Note:obese AAF in nad, appears healthy  General Mental  Status-Alert. General Appearance-Consistent with stated age. Hydration-Well hydrated. Voice-Normal.  Head and Neck Head-normocephalic, atraumatic with no lesions or palpable masses. Trachea-midline. Thyroid Gland Characteristics - normal size and consistency.  Eye Eyeball - Bilateral-Extraocular movements intact. Sclera/Conjunctiva - Bilateral-No scleral icterus.  Chest and Lung Exam Chest and lung exam reveals -quiet, even and easy respiratory effort with no use of accessory muscles and on auscultation, normal breath sounds, no adventitious sounds and normal vocal resonance. Inspection Chest Wall - Normal. Back - normal.  Breast - Did not examine.  Cardiovascular Cardiovascular examination reveals -normal heart sounds, regular rate and rhythm with no murmurs and normal pedal pulses bilaterally.  Abdomen Inspection Inspection of the abdomen reveals - No Hernias. Skin - Scar - no surgical scars. Palpation/Percussion Palpation and Percussion of the abdomen reveal - Soft, Non Tender, No Rebound tenderness, No Rigidity (guarding) and No hepatosplenomegaly. Auscultation Auscultation of the abdomen reveals - Bowel sounds normal.  Peripheral Vascular Upper Extremity Palpation - Pulses bilaterally normal.  Neurologic Neurologic evaluation reveals -alert and oriented x 3 with no impairment of recent or remote memory. Mental Status-Normal.  Neuropsychiatric The patient's mood and affect are described as -normal. Judgment and Insight-insight is appropriate concerning matters relevant to self.  Musculoskeletal Normal Exam - Left-Upper Extremity Strength Normal and Lower Extremity Strength Normal. Normal Exam - Right-Upper Extremity Strength Normal and Lower Extremity Strength Normal.  Lymphatic Head & Neck  General Head & Neck Lymphatics: Bilateral - Description - Normal. Axillary - Did not examine. Femoral & Inguinal - Did not  examine.    Assessment & Plan Maria Areola M. Avonna Iribe MD; 11/13/2020 6:02 PM)  SYMPTOMATIC CHOLELITHIASIS (K80.20) Impression: I believe the patient's symptoms are consistent with gallbladder disease. we had discussed gallbladder disease when we met in the hospital in December  We rediscussed gallbladder disease. The patient was given Agricultural engineer. We discussed non-operative and operative management. We discussed the signs & symptoms of acute cholecystitis  I discussed laparoscopic cholecystectomy with IOC in detail. The patient was given educational material as well as diagrams detailing the procedure. We discussed the risks  and benefits of a laparoscopic cholecystectomy including, but not limited to bleeding, infection, injury to surrounding structures such as the intestine or liver, bile leak, retained gallstones, need to convert to an open procedure, prolonged diarrhea, blood clots such as DVT, common bile duct injury, anesthesia risks, and possible need for additional procedures. We discussed the typical post-operative recovery course. I explained that the likelihood of improvement of their symptoms is good.  The patient has elected to proceed with surgery.  This patient encounter took 35 minutes today to perform the following: take history, perform exam, review outside records, interpret imaging, counsel the patient on their diagnosis and document encounter, findings & plan in the EHR  Current Plans Pt Education - Pamphlet Given - Laparoscopic Gallbladder Surgery: discussed with patient and provided information. You are being scheduled for surgery- Our schedulers will call you.  You should hear from our office's scheduling department within 5 working days about the location, date, and time of surgery. We try to make accommodations for patient's preferences in scheduling surgery, but sometimes the OR schedule or the surgeon's schedule prevents [place] from making those accommodations.  If  you have not heard from our office [phone]) in 5 working days, call the office and ask for your surgeon's nurse.  If you have other questions about your diagnosis, plan, or surgery, call the office and ask for your surgeon's nurse.   ELEVATED LFTS (R79.89)   DILATED BILE DUCT (K83.8) Impression: On an ultrasound at [facility] on December 28 she had a dilated intra-and extrahepatic bile ducts. Her LFTs were also higher than when she was at [facility]. We discussed the possibility that she may have a common bile duct stone. We also discussed the possibility that she may need perioperative ERCP. Right now she looks clinically well. She has not had an episode since late December. She is not tachycardic. There is no evidence of scleral icterus. Therefore I do not think she needs to be emergently done. However I would like to get her on the surgery schedule as quickly as possible. We will check her labs preoperatively. If her bilirubin is still significantly elevated above 4, then we may need to do preoperative ERCP.   HYPERTENSION, ESSENTIAL (I10)   ASTHMA IN ADULT WITHOUT COMPLICATION, UNSPECIFIED ASTHMA SEVERITY, UNSPECIFIED WHETHER PERSISTENT (J45.909)   HISTORY OF COVID-19 (U79.98) Impression: 10/09/20  Maria Fields. Redmond Pulling, MD, FACS General, Bariatric, & Minimally Invasive Surgery [facility], [place]

## 2020-11-17 ENCOUNTER — Other Ambulatory Visit: Payer: Self-pay

## 2020-11-17 ENCOUNTER — Encounter (HOSPITAL_COMMUNITY)
Admission: RE | Admit: 2020-11-17 | Discharge: 2020-11-17 | Disposition: A | Discharge status: Home / Self Care | Payer: Medicaid Other | Source: Ambulatory Visit | Attending: General Surgery | Admitting: General Surgery

## 2020-11-17 ENCOUNTER — Inpatient Hospital Stay (HOSPITAL_COMMUNITY)
Admission: RE | Admit: 2020-11-17 | Discharge: 2020-11-17 | Disposition: A | Discharge status: Home / Self Care | Payer: Medicaid Other | Source: Ambulatory Visit

## 2020-11-17 ENCOUNTER — Encounter (HOSPITAL_COMMUNITY): Payer: Self-pay

## 2020-11-17 DIAGNOSIS — Z01812 Encounter for preprocedural laboratory examination: Secondary | ICD-10-CM | POA: Diagnosis present

## 2020-11-17 HISTORY — DX: Gastro-esophageal reflux disease without esophagitis: K21.9

## 2020-11-17 HISTORY — DX: Unspecified asthma, uncomplicated: J45.909

## 2020-11-17 HISTORY — DX: Headache, unspecified: R51.9

## 2020-11-17 LAB — CBC WITH DIFFERENTIAL/PLATELET
Abs Immature Granulocytes: 0.02 10*3/uL (ref 0.00–0.07)
Basophils Absolute: 0 10*3/uL (ref 0.0–0.1)
Basophils Relative: 0 %
Eosinophils Absolute: 0.2 10*3/uL (ref 0.0–0.5)
Eosinophils Relative: 2 %
HCT: 35 % — ABNORMAL LOW (ref 36.0–46.0)
Hemoglobin: 11.1 g/dL — ABNORMAL LOW (ref 12.0–15.0)
Immature Granulocytes: 0 %
Lymphocytes Relative: 31 %
Lymphs Abs: 2.6 10*3/uL (ref 0.7–4.0)
MCH: 24 pg — ABNORMAL LOW (ref 26.0–34.0)
MCHC: 31.7 g/dL (ref 30.0–36.0)
MCV: 75.8 fL — ABNORMAL LOW (ref 80.0–100.0)
Monocytes Absolute: 0.4 10*3/uL (ref 0.1–1.0)
Monocytes Relative: 5 %
Neutro Abs: 4.9 10*3/uL (ref 1.7–7.7)
Neutrophils Relative %: 62 %
Platelets: 343 10*3/uL (ref 150–400)
RBC: 4.62 MIL/uL (ref 3.87–5.11)
RDW: 17 % — ABNORMAL HIGH (ref 11.5–15.5)
WBC: 8.1 10*3/uL (ref 4.0–10.5)
nRBC: 0 % (ref 0.0–0.2)

## 2020-11-17 LAB — COMPREHENSIVE METABOLIC PANEL
ALT: 23 U/L (ref 0–44)
AST: 20 U/L (ref 15–41)
Albumin: 3.7 g/dL (ref 3.5–5.0)
Alkaline Phosphatase: 101 U/L (ref 38–126)
Anion gap: 11 (ref 5–15)
BUN: 9 mg/dL (ref 6–20)
CO2: 24 mmol/L (ref 22–32)
Calcium: 8.7 mg/dL — ABNORMAL LOW (ref 8.9–10.3)
Chloride: 102 mmol/L (ref 98–111)
Creatinine, Ser: 0.68 mg/dL (ref 0.44–1.00)
GFR, Estimated: >60 mL/min (ref >60)
Glucose, Bld: 87 mg/dL (ref 70–99)
Potassium: 2.9 mmol/L — ABNORMAL LOW (ref 3.5–5.1)
Sodium: 137 mmol/L (ref 135–145)
Total Bilirubin: 0.7 mg/dL (ref 0.3–1.2)
Total Protein: 7.5 g/dL (ref 6.5–8.1)

## 2020-11-17 NOTE — Progress Notes (Signed)
COVID Vaccine Completed:no Date COVID Vaccine completed: COVID vaccine manufacturer: [employer]   PCP - Donetta Potts [facility] [place], [place] Cardiologist - no  Chest x-ray - 10/10/20-epic EKG - 10/13/20-epic Stress Test - no ECHO - no Cardiac Cath - no Pacemaker/ICD device last checked:NA  Sleep Study - no CPAP -   Fasting Blood Sugar - NA Checks Blood Sugar _____ times a day  Blood Thinner Instructions:NA Aspirin Instructions: Last Dose:  Anesthesia review:   Patient denies shortness of breath, fever, cough and chest pain at PAT appointment yes Patient verbalized understanding of instructions that were given to them at the PAT appointment. Patient was also instructed that they will need to review over the PAT instructions again at home before surgery. Yes Pt reports no SOB with any activities.  Her BP was elevated at PAT visit.  I uses 2 sizes of cuffs bilaterally on upper and lower arm. I took it prior to the visit and after the visit. The diastolic was consistently over 100-110.  Jodell Cipro  PA was notified.   I recorded her pressures and told her to call her PCP today.

## 2020-11-17 NOTE — Patient Instructions (Addendum)
DUE TO COVID-19 ONLY ONE VISITOR IS ALLOWED TO COME WITH YOU AND STAY IN THE WAITING ROOM ONLY DURING PRE OP AND PROCEDURE DAY OF SURGERY. THE 1 VISITOR  MAY VISIT WITH YOU AFTER SURGERY IN YOUR PRIVATE ROOM DURING VISITING HOURS ONLY!  . ONCE YOUR COVID TEST IS COMPLETED,  PLEASE BEGIN THE QUARANTINE INSTRUCTIONS AS OUTLINED IN YOUR HANDOUT.                Maria Fields   Your procedure is scheduled on: 11/20/20   Report to [facility] Main  Entrance   Report to admitting at  9:30 AM     Call this number if you have problems the morning of surgery [phone]    . BRUSH YOUR TEETH MORNING OF SURGERY AND RINSE YOUR MOUTH OUT, NO CHEWING GUM CANDY OR MINTS.  No food after midnight.    You may have clear liquid until 8:30 AM  .  At 8:00 AM drink pre surgery drink  . Nothing by mouth after 8:30 AM.   Take these medicines the morning of surgery with A SIP OF WATER: Nifedipine. Use your inhaler and bring it with you                                 You may not have any metal on your body including hair pins and              piercings  Do not wear jewelry, make-up, lotions, powders or perfumes, deodorant             Do not wear nail polish on your fingernails.  Do not shave  48 hours prior to surgery.                Do not bring valuables to the hospital. [facility] IS NOT             RESPONSIBLE   FOR VALUABLES.  Contacts, dentures or bridgework may not be worn into surgery.      Patients discharged the day of surgery will not be allowed to drive home  . IF YOU ARE HAVING SURGERY AND GOING HOME THE SAME DAY, YOU MUST HAVE AN ADULT TO DRIVE YOU HOME AND BE WITH YOU FOR 24 HOURS. YOU MAY GO HOME BY TAXI OR UBER OR ORTHERWISE, BUT AN ADULT MUST ACCOMPANY YOU HOME AND STAY WITH YOU FOR 24 HOURS.  Name and phone number of your driver:  Special Instructions: N/A              Please read over the following fact sheets you were  given: _____________________________________________________________________             [facility] - Preparing for Surgery Before surgery, you can play an important role.  Because skin is not sterile, your skin needs to be as free of germs as possible.  You can reduce the number of germs on your skin by washing with CHG (chlorahexidine gluconate) soap before surgery.  CHG is an antiseptic cleaner which kills germs and bonds with the skin to continue killing germs even after washing. Please DO NOT use if you have an allergy to CHG or antibacterial soaps.  If your skin becomes reddened/irritated stop using the CHG and inform your nurse when you arrive at Short Stay. Do not shave (including legs and underarms) for at least 48 hours prior to the first CHG shower.  Marland Kitchen  Please follow these instructions carefully:  1.  Shower with CHG Soap the night before surgery and the  morning of Surgery.  2.  If you choose to wash your hair, wash your hair first as usual with your  normal  shampoo.  3.  After you shampoo, rinse your hair and body thoroughly to remove the  shampoo.                                        4.  Use CHG as you would any other liquid soap.  You can apply chg directly  to the skin and wash                       Gently with a scrungie or clean washcloth.  5.  Apply the CHG Soap to your body ONLY FROM THE NECK DOWN.   Do not use on face/ open                           Wound or open sores. Avoid contact with eyes, ears mouth and genitals (private parts).                       Wash face,  Genitals (private parts) with your normal soap.             6.  Wash thoroughly, paying special attention to the area where your surgery  will be performed.  7.  Thoroughly rinse your body with warm water from the neck down.  8.  DO NOT shower/wash with your normal soap after using and rinsing off  the CHG Soap.             9.  Pat yourself dry with a clean towel.            10.  Wear clean pajamas.             11.  Place clean sheets on your bed the night of your first shower and do not  sleep with pets. Day of Surgery : Do not apply any lotions/deodorants the morning of surgery.  Please wear clean clothes to the hospital/surgery center.  FAILURE TO FOLLOW THESE INSTRUCTIONS MAY RESULT IN THE CANCELLATION OF YOUR SURGERY PATIENT SIGNATURE_________________________________  NURSE SIGNATURE__________________________________  ________________________________________________________________________

## 2020-11-17 NOTE — Progress Notes (Signed)
Patient tested positive 10/09/20. No need to retest, per guidelines. Patient also denies any flu-like symptoms today. Patient notified.

## 2020-11-19 NOTE — Anesthesia Preprocedure Evaluation (Signed)
Anesthesia Evaluation  Patient identified by MRN, date of birth, ID band Patient awake    Reviewed: Allergy & Precautions, NPO status , Patient's Chart, lab work & pertinent test results  History of Anesthesia Complications Negative for: history of anesthetic complications  Airway Mallampati: III  TM Distance: >3 FB Neck ROM: Full    Dental  (+) Chipped,    Pulmonary asthma , former smoker,    Pulmonary exam normal        Cardiovascular hypertension, Pt. on medications Normal cardiovascular exam     Neuro/Psych negative neurological ROS  negative psych ROS   GI/Hepatic Neg liver ROS, GERD  Controlled,  Endo/Other  negative endocrine ROS  Renal/GU negative Renal ROS  negative genitourinary   Musculoskeletal negative musculoskeletal ROS (+)   Abdominal   Peds  Hematology Hgb 11.1   Anesthesia Other Findings Day of surgery medications reviewed with patient.  Reproductive/Obstetrics negative OB ROS                            Anesthesia Physical Anesthesia Plan  ASA: III  Anesthesia Plan: General   Post-op Pain Management:    Induction: Intravenous  PONV Risk Score and Plan: 4 or greater and Treatment may vary due to age or medical condition, Ondansetron, Dexamethasone, Midazolam and Scopolamine patch - Pre-op  Airway Management Planned: Oral ETT  Additional Equipment: None  Intra-op Plan:   Post-operative Plan: Extubation in OR  Informed Consent: I have reviewed the patients History and Physical, chart, labs and discussed the procedure including the risks, benefits and alternatives for the proposed anesthesia with the patient or authorized representative who has indicated his/her understanding and acceptance.     Dental advisory given  Plan Discussed with: CRNA  Anesthesia Plan Comments:        Anesthesia Quick Evaluation

## 2020-11-20 ENCOUNTER — Encounter (HOSPITAL_COMMUNITY): Payer: Self-pay | Admitting: General Surgery

## 2020-11-20 ENCOUNTER — Ambulatory Visit (HOSPITAL_COMMUNITY)
Admission: RE | Admit: 2020-11-20 | Discharge: 2020-11-20 | Disposition: A | Discharge status: Home / Self Care | Payer: Medicaid Other | Attending: General Surgery | Admitting: General Surgery

## 2020-11-20 ENCOUNTER — Ambulatory Visit (HOSPITAL_COMMUNITY): Payer: Medicaid Other

## 2020-11-20 ENCOUNTER — Ambulatory Visit (HOSPITAL_COMMUNITY): Payer: Medicaid Other | Admitting: Physician Assistant

## 2020-11-20 ENCOUNTER — Ambulatory Visit (HOSPITAL_COMMUNITY): Payer: Medicaid Other | Admitting: Certified Registered Nurse Anesthetist

## 2020-11-20 ENCOUNTER — Encounter (HOSPITAL_COMMUNITY)
Admission: RE | Disposition: A | Discharge status: Home / Self Care | Payer: Self-pay | Source: Home / Self Care | Attending: General Surgery

## 2020-11-20 DIAGNOSIS — K429 Umbilical hernia without obstruction or gangrene: Secondary | ICD-10-CM | POA: Insufficient documentation

## 2020-11-20 DIAGNOSIS — Z419 Encounter for procedure for purposes other than remedying health state, unspecified: Secondary | ICD-10-CM

## 2020-11-20 DIAGNOSIS — Z87891 Personal history of nicotine dependence: Secondary | ICD-10-CM | POA: Diagnosis not present

## 2020-11-20 DIAGNOSIS — Z809 Family history of malignant neoplasm, unspecified: Secondary | ICD-10-CM | POA: Diagnosis not present

## 2020-11-20 DIAGNOSIS — K8010 Calculus of gallbladder with chronic cholecystitis without obstruction: Secondary | ICD-10-CM | POA: Insufficient documentation

## 2020-11-20 DIAGNOSIS — Z803 Family history of malignant neoplasm of breast: Secondary | ICD-10-CM | POA: Diagnosis not present

## 2020-11-20 DIAGNOSIS — Z8249 Family history of ischemic heart disease and other diseases of the circulatory system: Secondary | ICD-10-CM | POA: Diagnosis not present

## 2020-11-20 DIAGNOSIS — Z8616 Personal history of COVID-19: Secondary | ICD-10-CM | POA: Diagnosis not present

## 2020-11-20 DIAGNOSIS — K828 Other specified diseases of gallbladder: Secondary | ICD-10-CM | POA: Insufficient documentation

## 2020-11-20 HISTORY — PX: CHOLECYSTECTOMY: SHX55

## 2020-11-20 LAB — PREGNANCY, URINE: Preg Test, Ur: NEGATIVE

## 2020-11-20 SURGERY — LAPAROSCOPIC CHOLECYSTECTOMY WITH INTRAOPERATIVE CHOLANGIOGRAM
Anesthesia: General | Site: Abdomen

## 2020-11-20 MED ORDER — INDOCYANINE GREEN 25 MG IV SOLR
7.5000 mg | Freq: Once | INTRAVENOUS | Status: DC
  Filled 2020-11-20: qty 10

## 2020-11-20 MED ORDER — LACTATED RINGERS IV SOLN: INTRAVENOUS | Status: DC

## 2020-11-20 MED ORDER — SUGAMMADEX SODIUM 500 MG/5ML IV SOLN
INTRAVENOUS | Status: DC | PRN
  Administered 2020-11-20: 300 mg via INTRAVENOUS

## 2020-11-20 MED ORDER — ONDANSETRON HCL 4 MG/2ML IJ SOLN
INTRAMUSCULAR | Status: DC | PRN
  Administered 2020-11-20: 4 mg via INTRAVENOUS

## 2020-11-20 MED ORDER — CHLORHEXIDINE GLUCONATE CLOTH 2 % EX PADS: 6.0000 | MEDICATED_PAD | Freq: Once | CUTANEOUS | Status: DC

## 2020-11-20 MED ORDER — SUGAMMADEX SODIUM 500 MG/5ML IV SOLN
INTRAVENOUS | Status: AC
  Filled 2020-11-20: qty 5

## 2020-11-20 MED ORDER — PHENYLEPHRINE 40 MCG/ML (10ML) SYRINGE FOR IV PUSH (FOR BLOOD PRESSURE SUPPORT)
PREFILLED_SYRINGE | INTRAVENOUS | Status: AC
  Filled 2020-11-20: qty 10

## 2020-11-20 MED ORDER — PHENYLEPHRINE 40 MCG/ML (10ML) SYRINGE FOR IV PUSH (FOR BLOOD PRESSURE SUPPORT)
PREFILLED_SYRINGE | INTRAVENOUS | Status: AC
  Filled 2020-11-20: qty 20

## 2020-11-20 MED ORDER — FENTANYL CITRATE (PF) 100 MCG/2ML IJ SOLN
25.0000 ug | INTRAMUSCULAR | Status: DC | PRN
Start: 2020-11-20 — End: 2020-11-20
  Administered 2020-11-20 (×2): 50 ug via INTRAVENOUS

## 2020-11-20 MED ORDER — FENTANYL CITRATE (PF) 100 MCG/2ML IJ SOLN
INTRAMUSCULAR | Status: AC
  Filled 2020-11-20: qty 2

## 2020-11-20 MED ORDER — KETOROLAC TROMETHAMINE 15 MG/ML IJ SOLN
INTRAMUSCULAR | Status: DC | PRN
  Administered 2020-11-20: 15 mg via INTRAVENOUS

## 2020-11-20 MED ORDER — ROCURONIUM BROMIDE 10 MG/ML (PF) SYRINGE
PREFILLED_SYRINGE | INTRAVENOUS | Status: DC | PRN
  Administered 2020-11-20: 60 mg via INTRAVENOUS

## 2020-11-20 MED ORDER — SODIUM CHLORIDE 0.9 % IV SOLN
2.0000 g | INTRAVENOUS | Status: AC
  Administered 2020-11-20: 2 g via INTRAVENOUS
  Filled 2020-11-20: qty 2

## 2020-11-20 MED ORDER — IOHEXOL 300 MG/ML  SOLN
INTRAMUSCULAR | Status: DC | PRN
  Administered 2020-11-20: 7 mL

## 2020-11-20 MED ORDER — PROPOFOL 10 MG/ML IV BOLUS
INTRAVENOUS | Status: AC
  Filled 2020-11-20: qty 20

## 2020-11-20 MED ORDER — ROCURONIUM BROMIDE 10 MG/ML (PF) SYRINGE
PREFILLED_SYRINGE | INTRAVENOUS | Status: AC
  Filled 2020-11-20: qty 10

## 2020-11-20 MED ORDER — MIDAZOLAM HCL 5 MG/5ML IJ SOLN
INTRAMUSCULAR | Status: DC | PRN
  Administered 2020-11-20: 2 mg via INTRAVENOUS

## 2020-11-20 MED ORDER — BUPIVACAINE-EPINEPHRINE 0.25% -1:200000 IJ SOLN
INTRAMUSCULAR | Status: DC | PRN
  Administered 2020-11-20: 30 mL

## 2020-11-20 MED ORDER — LIDOCAINE HCL 2 % IJ SOLN
INTRAMUSCULAR | Status: AC
  Filled 2020-11-20: qty 20

## 2020-11-20 MED ORDER — FENTANYL CITRATE (PF) 100 MCG/2ML IJ SOLN
INTRAMUSCULAR | Status: DC | PRN
  Administered 2020-11-20: 50 ug via INTRAVENOUS
  Administered 2020-11-20: 100 ug via INTRAVENOUS
  Administered 2020-11-20 (×2): 50 ug via INTRAVENOUS

## 2020-11-20 MED ORDER — BUPIVACAINE-EPINEPHRINE (PF) 0.25% -1:200000 IJ SOLN
INTRAMUSCULAR | Status: AC
  Filled 2020-11-20: qty 30

## 2020-11-20 MED ORDER — 0.9 % SODIUM CHLORIDE (POUR BTL) OPTIME
TOPICAL | Status: DC | PRN
  Administered 2020-11-20: 1000 mL

## 2020-11-20 MED ORDER — OXYCODONE HCL 5 MG PO TABS: 5.0000 mg | ORAL_TABLET | Freq: Once | ORAL | Status: DC | PRN

## 2020-11-20 MED ORDER — SCOPOLAMINE 1 MG/3DAYS TD PT72
1.0000 | MEDICATED_PATCH | Freq: Once | TRANSDERMAL | Status: DC
  Administered 2020-11-20: 1.5 mg via TRANSDERMAL
  Filled 2020-11-20: qty 1

## 2020-11-20 MED ORDER — OXYCODONE HCL 5 MG/5ML PO SOLN: 5.0000 mg | Freq: Once | ORAL | Status: DC | PRN

## 2020-11-20 MED ORDER — ACETAMINOPHEN 500 MG PO TABS: 1000.0000 mg | ORAL_TABLET | Freq: Once | ORAL | Status: DC

## 2020-11-20 MED ORDER — ACETAMINOPHEN 500 MG PO TABS
1000.0000 mg | ORAL_TABLET | ORAL | Status: AC
  Administered 2020-11-20: 1000 mg via ORAL
  Filled 2020-11-20: qty 2

## 2020-11-20 MED ORDER — DEXAMETHASONE SODIUM PHOSPHATE 10 MG/ML IJ SOLN
INTRAMUSCULAR | Status: DC | PRN
  Administered 2020-11-20: 5 mg via INTRAVENOUS

## 2020-11-20 MED ORDER — PROMETHAZINE HCL 25 MG/ML IJ SOLN: 6.2500 mg | INTRAMUSCULAR | Status: DC | PRN

## 2020-11-20 MED ORDER — ORAL CARE MOUTH RINSE: 15.0000 mL | Freq: Once | OROMUCOSAL | Status: AC

## 2020-11-20 MED ORDER — ACETAMINOPHEN 500 MG PO TABS: 1000.0000 mg | ORAL_TABLET | Freq: Three times a day (TID) | ORAL | 0 refills | Status: AC

## 2020-11-20 MED ORDER — GABAPENTIN 300 MG PO CAPS
300.0000 mg | ORAL_CAPSULE | ORAL | Status: AC
  Administered 2020-11-20: 300 mg via ORAL
  Filled 2020-11-20: qty 1

## 2020-11-20 MED ORDER — LACTATED RINGERS IR SOLN
Status: DC | PRN
  Administered 2020-11-20: 1000 mL

## 2020-11-20 MED ORDER — OXYCODONE HCL 5 MG PO TABS: 5.0000 mg | ORAL_TABLET | Freq: Four times a day (QID) | ORAL | 0 refills | Status: AC | PRN

## 2020-11-20 MED ORDER — PROPOFOL 10 MG/ML IV BOLUS
INTRAVENOUS | Status: DC | PRN
  Administered 2020-11-20: 200 mg via INTRAVENOUS

## 2020-11-20 MED ORDER — LIDOCAINE 2% (20 MG/ML) 5 ML SYRINGE
INTRAMUSCULAR | Status: DC | PRN
  Administered 2020-11-20: 100 mg via INTRAVENOUS

## 2020-11-20 MED ORDER — MIDAZOLAM HCL 2 MG/2ML IJ SOLN
INTRAMUSCULAR | Status: AC
  Filled 2020-11-20: qty 2

## 2020-11-20 MED ORDER — CHLORHEXIDINE GLUCONATE 0.12 % MT SOLN
15.0000 mL | Freq: Once | OROMUCOSAL | Status: AC
  Administered 2020-11-20: 15 mL via OROMUCOSAL

## 2020-11-20 SURGICAL SUPPLY — 49 items
APPLICATOR ARISTA FLEXITIP XL (MISCELLANEOUS) IMPLANT
APPLIER CLIP 5 13 M/L LIGAMAX5 (MISCELLANEOUS) ×2
APPLIER CLIP ROT 10 11.4 M/L (STAPLE)
BENZOIN TINCTURE PRP APPL 2/3 (GAUZE/BANDAGES/DRESSINGS) IMPLANT
BNDG ADH 1X3 SHEER STRL LF (GAUZE/BANDAGES/DRESSINGS) IMPLANT
CABLE HIGH FREQUENCY MONO STRZ (ELECTRODE) ×2 IMPLANT
CHLORAPREP W/TINT 26 (MISCELLANEOUS) ×2 IMPLANT
CLIP APPLIE 5 13 M/L LIGAMAX5 (MISCELLANEOUS) ×1 IMPLANT
CLIP APPLIE ROT 10 11.4 M/L (STAPLE) IMPLANT
CLIP VESOLOCK MED LG 6/CT (CLIP) IMPLANT
COVER MAYO STAND STRL (DRAPES) ×2 IMPLANT
COVER SURGICAL LIGHT HANDLE (MISCELLANEOUS) ×2 IMPLANT
COVER WAND RF STERILE (DRAPES) IMPLANT
DECANTER SPIKE VIAL GLASS SM (MISCELLANEOUS) ×2 IMPLANT
DERMABOND ADVANCED (GAUZE/BANDAGES/DRESSINGS)
DERMABOND ADVANCED .7 DNX12 (GAUZE/BANDAGES/DRESSINGS) IMPLANT
DRAPE C-ARM 42X120 X-RAY (DRAPES) ×2 IMPLANT
DRSG TEGADERM 2-3/8X2-3/4 SM (GAUZE/BANDAGES/DRESSINGS) ×8 IMPLANT
ELECT REM PT RETURN 15FT ADLT (MISCELLANEOUS) ×2 IMPLANT
GAUZE SPONGE 2X2 8PLY STRL LF (GAUZE/BANDAGES/DRESSINGS) ×1 IMPLANT
GLOVE BIO SURGEON STRL SZ7.5 (GLOVE) ×2 IMPLANT
GLOVE INDICATOR 8.0 STRL GRN (GLOVE) ×2 IMPLANT
GOWN STRL REUS W/TWL XL LVL3 (GOWN DISPOSABLE) ×6 IMPLANT
GRASPER SUT TROCAR 14GX15 (MISCELLANEOUS) ×2 IMPLANT
HEMOSTAT ARISTA ABSORB 3G PWDR (HEMOSTASIS) IMPLANT
HEMOSTAT SNOW SURGICEL 2X4 (HEMOSTASIS) IMPLANT
KIT BASIN OR (CUSTOM PROCEDURE TRAY) ×2 IMPLANT
KIT TURNOVER KIT A (KITS) ×2 IMPLANT
L-HOOK LAP DISP 36CM (ELECTROSURGICAL)
LHOOK LAP DISP 36CM (ELECTROSURGICAL) IMPLANT
POUCH RETRIEVAL ECOSAC 10 (ENDOMECHANICALS) ×1 IMPLANT
POUCH RETRIEVAL ECOSAC 10MM (ENDOMECHANICALS) ×1
SCISSORS LAP 5X35 DISP (ENDOMECHANICALS) ×2 IMPLANT
SET CHOLANGIOGRAPH MIX (MISCELLANEOUS) ×2 IMPLANT
SET IRRIG TUBING LAPAROSCOPIC (IRRIGATION / IRRIGATOR) ×2 IMPLANT
SET TUBE SMOKE EVAC HIGH FLOW (TUBING) ×2 IMPLANT
SLEEVE XCEL OPT CAN 5 100 (ENDOMECHANICALS) ×4 IMPLANT
SPONGE GAUZE 2X2 STER 10/PKG (GAUZE/BANDAGES/DRESSINGS) ×1
STRIP CLOSURE SKIN 1/2X4 (GAUZE/BANDAGES/DRESSINGS) ×2 IMPLANT
SUT MNCRL AB 4-0 PS2 18 (SUTURE) ×2 IMPLANT
SUT VIC AB 0 UR5 27 (SUTURE) IMPLANT
SUT VICRYL 0 TIES 12 18 (SUTURE) IMPLANT
SUT VICRYL 0 UR6 27IN ABS (SUTURE) IMPLANT
TOWEL OR 17X26 10 PK STRL BLUE (TOWEL DISPOSABLE) ×2 IMPLANT
TOWEL OR NON WOVEN STRL DISP B (DISPOSABLE) ×2 IMPLANT
TRAY LAPAROSCOPIC (CUSTOM PROCEDURE TRAY) ×2 IMPLANT
TROCAR BLADELESS OPT 5 100 (ENDOMECHANICALS) ×2 IMPLANT
TROCAR XCEL BLUNT TIP 100MML (ENDOMECHANICALS) IMPLANT
TROCAR XCEL NON-BLD 11X100MML (ENDOMECHANICALS) IMPLANT

## 2020-11-20 NOTE — Anesthesia Procedure Notes (Addendum)
Procedure Name: Intubation Date/Time: 11/20/2020 12:14 PM Performed by: West Pugh, CRNA Pre-anesthesia Checklist: Patient identified, Emergency Drugs available, Suction available, Patient being monitored and Timeout performed Patient Re-evaluated:Patient Re-evaluated prior to induction Oxygen Delivery Method: Circle system utilized Preoxygenation: Pre-oxygenation with 100% oxygen Induction Type: IV induction Ventilation: Oral airway inserted - appropriate to patient size, Two handed mask ventilation required and Mask ventilation throughout procedure Laryngoscope Size: Mac and 3 Grade View: Grade I Tube type: Oral Tube size: 7.0 mm Number of attempts: 1 Airway Equipment and Method: Stylet Placement Confirmation: ETT inserted through vocal cords under direct vision,  positive ETCO2 and breath sounds checked- equal and bilateral Secured at: 21 cm Tube secured with: Tape Dental Injury: Teeth and Oropharynx as per pre-operative assessment  Comments: AOI

## 2020-11-20 NOTE — Discharge Instructions (Signed)
[facility] °LAPAROSCOPIC SURGERY: POST OP INSTRUCTIONS °Always review your discharge instruction sheet given to you by the facility where your surgery was performed. °IF YOU HAVE DISABILITY OR FAMILY LEAVE FORMS, YOU MUST BRING THEM TO THE OFFICE FOR PROCESSING.   °DO NOT GIVE THEM TO YOUR DOCTOR. ° °PAIN CONTROL ° °1. First take acetaminophen (Tylenol) AND/or ibuprofen (Advil) to control your pain after surgery.  Follow directions on package.  Taking acetaminophen (Tylenol) and/or ibuprofen (Advil) regularly after surgery will help to control your pain and lower the amount of prescription pain medication you may need.  You should not take more than 3,000 mg (3 grams) of acetaminophen (Tylenol) in 24 hours.  You should not take ibuprofen (Advil), aleve, motrin, naprosyn or other NSAIDS if you have a history of stomach ulcers or chronic kidney disease.  °2. A prescription for pain medication may be given to you upon discharge.  Take your pain medication as prescribed, if you still have uncontrolled pain after taking acetaminophen (Tylenol) or ibuprofen (Advil). °3. Use ice packs to help control pain. °4. If you need a refill on your pain medication, please contact your pharmacy.  They will contact our office to request authorization. Prescriptions will not be filled after 5pm or on week-ends. ° °HOME MEDICATIONS °5. Take your usually prescribed medications unless otherwise directed. ° °DIET °6. You should follow a light diet the first few days after arrival home.  Be sure to include lots of fluids daily. Avoid fatty, fried foods.  ° °CONSTIPATION °7. It is common to experience some constipation after surgery and if you are taking pain medication.  Increasing fluid intake and taking a stool softener (such as Colace) will usually help or prevent this problem from occurring.  A mild laxative (Milk of Magnesia or Miralax) should be taken according to package instructions if there are no bowel  movements after 48 hours. ° °WOUND/INCISION CARE °8. Most patients will experience some swelling and bruising in the area of the incisions.  Ice packs will help.  Swelling and bruising can take several days to resolve.  °9. Unless discharge instructions indicate otherwise, follow guidelines below  °a. STERI-STRIPS - you may remove your outer bandages 48 hours after surgery, and you may shower at that time.  You have steri-strips (small skin tapes) in place directly over the incision.  These strips should be left on the skin for 7-10 days.   °b. DERMABOND/SKIN GLUE - you may shower in 24 hours.  The glue will flake off over the next 2-3 weeks. °10. Any sutures or staples will be removed at the office during your follow-up visit. ° °ACTIVITIES °11. You may resume regular (light) daily activities beginning the next day--such as daily self-care, walking, climbing stairs--gradually increasing activities as tolerated.  You may have sexual intercourse when it is comfortable.  Refrain from any heavy lifting or straining until approved by your doctor. °a. You may drive when you are no longer taking prescription pain medication, you can comfortably wear a seatbelt, and you can safely maneuver your car and apply brakes. ° °FOLLOW-UP °12. You should see your doctor in the office for a follow-up appointment approximately 2-3 weeks after your surgery.  You should have been given your post-op/follow-up appointment when your surgery was scheduled.  If you did not receive a post-op/follow-up appointment, make sure that you call for this appointment within a day or two after you arrive home to insure a convenient appointment time. ° °OTHER   INSTRUCTIONS °13. Do not lift/push/pull anything greater than 10lbs for 2 weeks ° °WHEN TO CALL YOUR DOCTOR: °1. Fever over 101.0 °2. Inability to urinate °3. Continued bleeding from incision. °4. Increased pain, redness, or drainage from the incision. °5. Increasing abdominal pain ° °The clinic  staff is available to answer your questions during regular business hours.  Please don’t hesitate to call and ask to speak to one of the nurses for clinical concerns.  If you have a medical emergency, go to the nearest emergency room or call 911.  A surgeon from [facility] is always on call at the hospital. °[address], [address], [place], [place]  [address] ? [address] [address], [place], [place]   [address] °[phone] ? [phone] ? FAX [phone] °Web site: [email or web address] ° °

## 2020-11-20 NOTE — Transfer of Care (Signed)
Immediate Anesthesia Transfer of Care Note  Patient: Maria Fields  Procedure(s) Performed: LAPAROSCOPIC CHOLECYSTECTOMY WITH INTRAOPERATIVE CHOLANGIOGRAM AND UMBILICAL HERNIA REPAIR (N/A Abdomen)  Patient Location: PACU  Anesthesia Type:General  Level of Consciousness: awake and drowsy  Airway & Oxygen Therapy: Patient Spontanous Breathing and Patient connected to face mask oxygen  Post-op Assessment: Report given to RN and Post -op Vital signs reviewed and stable  Post vital signs: Reviewed and stable  Last Vitals:  Vitals Value Taken Time  BP 145/105 11/20/20 1356  Temp    Pulse 108 11/20/20 1358  Resp 19 11/20/20 1358  SpO2 100 % 11/20/20 1358  Vitals shown include unvalidated device data.  Last Pain:  Vitals:   11/20/20 1100  TempSrc: Oral  PainSc: 0-No pain      Patients Stated Pain Goal: 3 (11/20/20 1100)  Complications: No complications documented.

## 2020-11-20 NOTE — Interval H&P Note (Signed)
History and Physical Interval Note:  11/20/2020 11:51 AM  Maria Fields  has presented today for surgery, with the diagnosis of SYMPTOMATIC CHOLELITHIASIS.  The various methods of treatment have been discussed with the patient and family. After consideration of risks, benefits and other options for treatment, the patient has consented to  Procedure(s): LAPAROSCOPIC CHOLECYSTECTOMY WITH INTRAOPERATIVE CHOLANGIOGRAM (N/A) as a surgical intervention.  The patient's history has been reviewed, patient examined, no change in status, stable for surgery.  I have reviewed the patient's chart and labs.  Questions were answered to the patient's satisfaction.    Mary Sella. Andrey Campanile, MD, FACS General, Bariatric, & Minimally Invasive Surgery [facility]  Gaynelle Adu

## 2020-11-20 NOTE — Anesthesia Postprocedure Evaluation (Signed)
Anesthesia Post Note  Patient: Maria Fields  Procedure(s) Performed: LAPAROSCOPIC CHOLECYSTECTOMY WITH INTRAOPERATIVE CHOLANGIOGRAM AND UMBILICAL HERNIA REPAIR (N/A Abdomen)     Patient location during evaluation: PACU Anesthesia Type: General Level of consciousness: awake and alert and oriented Pain management: pain level controlled Vital Signs Assessment: post-procedure vital signs reviewed and stable Respiratory status: spontaneous breathing, nonlabored ventilation and respiratory function stable Cardiovascular status: blood pressure returned to baseline Postop Assessment: no apparent nausea or vomiting Anesthetic complications: no   No complications documented.  Last Vitals:  Vitals:   11/20/20 1430 11/20/20 1445  BP: 123/89 (!) 126/98  Pulse: 88 87  Resp: 13 12  Temp:    SpO2: 100% 100%               Kaylyn Layer

## 2020-11-20 NOTE — Op Note (Signed)
Maria Fields [id number] 01-16-1991 11/20/2020  Laparoscopic Cholecystectomy with IOC with umbilical hernia repair Procedure Note  Indications: This patient presents with symptomatic gallbladder disease and will undergo laparoscopic cholecystectomy.  The patient had presented 5 weeks ago with symptomatic cholelithiasis.  She had elevated LFTs.  An MRCP demonstrated no choledocholithiasis.  She was found to be Covid positive.  Her LFTs decreased and her pain resolved.  Because she was Covid positive and had comorbidities we recommend performing cholecystectomy in a delayed fashion.  The patient was released from the emergency room.  She presented [facility] few [facility] later to [facility] with significantly elevated LFTs and a dilated bile duct on ultrasound.  She was discharged from their ER.  I saw her back in clinic.  And we scheduled her for cholecystectomy.  Her preoperative LFTs had normalized.  Pre-operative Diagnosis: symptomatic cholelithiasis   Post-operative Diagnosis: Same, umbilical hernia  Surgeon: Gaynelle Adu MD FACS  Assistants: Dr Murrell Redden , surgery resident  Anesthesia: General endotracheal anesthesia  Procedure Details  The patient was seen again in the Holding Room. The risks, benefits, complications, treatment options, and expected outcomes were discussed with the patient. The possibilities of reaction to medication, pulmonary aspiration, perforation of viscus, bleeding, recurrent infection, finding a normal gallbladder, the need for additional procedures, failure to diagnose a condition, the possible need to convert to an open procedure, and creating a complication requiring transfusion or operation were discussed with the patient. The likelihood of improving the patient's symptoms with return to their baseline status is good.  The patient and/or family concurred with the proposed plan, giving informed consent. The site of surgery properly noted. The patient was taken to Operating  Room, identified as Maria Fields and the procedure verified as Laparoscopic Cholecystectomy with Intraoperative Cholangiogram. A Time Out was held and the above information confirmed. Antibiotic prophylaxis was administered.   Prior to the induction of general anesthesia, antibiotic prophylaxis was administered. General endotracheal anesthesia was then administered and tolerated well. After the induction, the abdomen was prepped with Chloraprep and draped in the sterile fashion. The patient was positioned in the supine position.  Local anesthetic agent was injected into the skin near the umbilicus and an incision made. We dissected down to the abdominal fascia with blunt dissection.  The fascia was incised vertically and we entered the peritoneal cavity bluntly.  A pursestring suture of 0-Vicryl was placed around the fascial opening.  The Hasson cannula was inserted and secured with the stay suture.  Pneumoperitoneum was then created with CO2 and tolerated well without any adverse changes in the patient's vital signs. An 5-mm port was placed in the subxiphoid position.  Two 5-mm ports were placed in the right upper quadrant. All skin incisions were infiltrated with a local anesthetic agent before making the incision and placing the trocars.   We positioned the patient in reverse Trendelenburg, tilted slightly to the patient's left.  The gallbladder was identified, the fundus grasped and I retracted it cephalad. Adhesions were lysed bluntly and with the electrocautery where indicated by the resident, taking care not to injure any adjacent organs or viscus. The infundibulum was grasped and retracted laterally, exposing the peritoneum overlying the triangle of Calot. This was then divided and exposed in a blunt fashion. A critical view of the cystic duct and cystic artery was obtained.  The cystic duct was clearly identified and bluntly dissected circumferentially. The cystic duct was ligated with a clip  distally.   An incision was made in  the cystic duct and the [facility] cholangiogram catheter introduced. The catheter was secured using a clip. A cholangiogram was then obtained which showed good visualization of the distal and proximal biliary tree with no sign of filling defects or obstruction.  Contrast flowed easily into the duodenum. The catheter was then removed.   The cystic duct was then ligated with clips and divided. The cystic artery which had been identified & dissected free was ligated with clips and divided as well.   The gallbladder was dissected from the liver bed in retrograde fashion with the electrocautery. The gallbladder was removed and placed in an Ecco sac.  The gallbladder and Ecco sac were then removed through the umbilical port site. The liver bed was irrigated and inspected. Hemostasis was achieved with the electrocautery. Copious irrigation was utilized and was repeatedly aspirated until clear.  The pursestring suture was used to close the umbilical fascia.  She had an air leak.  She also had a small umbilical hernia.  She had some diastases as well.  3 additional interrupted 0 Vicryl sutures were placed with the PMI suture passer by the resident.  We again inspected the right upper quadrant for hemostasis.  The umbilical closure was inspected and there was no air leak and nothing trapped within the closure. Pneumoperitoneum was released as we removed the trocars.  4-0 Monocryl was used to close the skin.    steri-strips, and clean dressings were applied. The patient was then extubated and brought to the recovery room in stable condition. Instrument, sponge, and needle counts were correct at closure and at the conclusion of the case.   Findings: Probable chronic Cholecystitis with Cholelithiasis +critical view nml ioc addl sutures at umbilicus  Estimated Blood Loss: Minimal         Drains: none         Specimens: Gallbladder           Complications: None; patient tolerated  the procedure well.         Disposition: PACU - hemodynamically stable.   I was personally present & scrubbed during the entire procedure except for skin closure and immediately available  as documented in my operative note.  Condition: stable  Mary Sella. Andrey Campanile, MD, FACS General, Bariatric, & Minimally Invasive Surgery [facility], [place]

## 2020-11-21 ENCOUNTER — Encounter (HOSPITAL_COMMUNITY): Payer: Self-pay | Admitting: General Surgery

## 2020-11-21 LAB — SURGICAL PATHOLOGY

## 2021-04-01 IMAGING — US US ABDOMEN LIMITED
1 series · 14 of 25 positions shown · non-contrast
Comparison: None.

CLINICAL DATA: Right upper quadrant abdominal pain.

EXAM:
ULTRASOUND ABDOMEN LIMITED RIGHT UPPER QUADRANT

[Series 1: us abdomen limited · 14 of 85 slices shown]
[im 1/85]
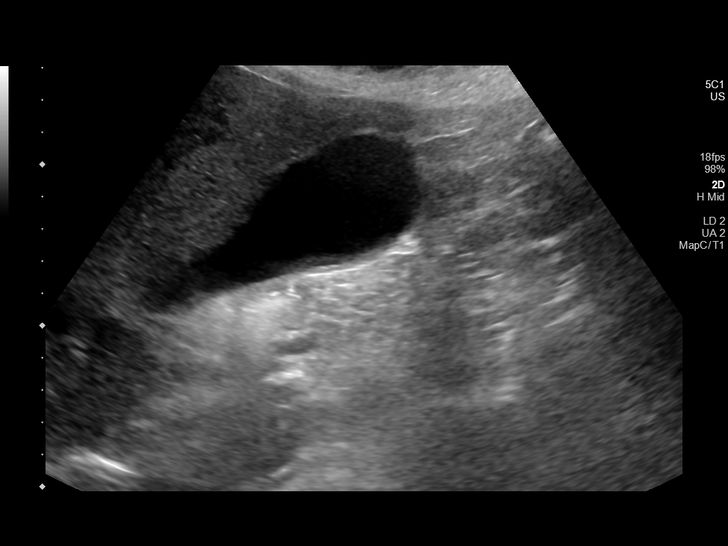
[im 8/85]
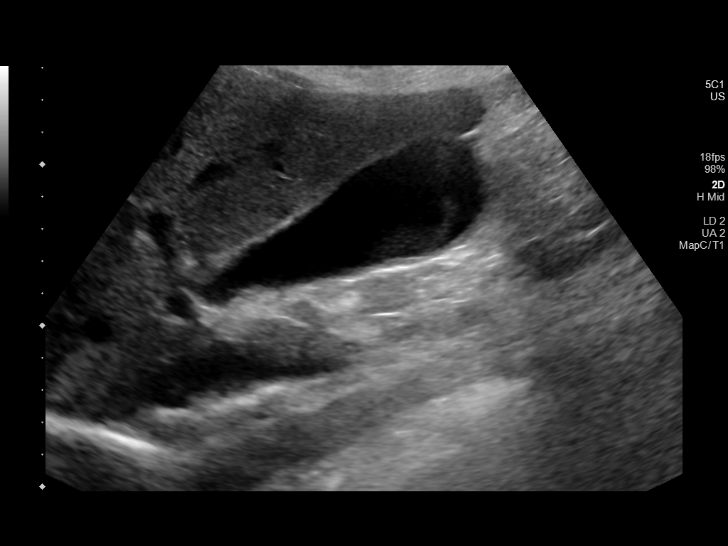
[im 15/85]
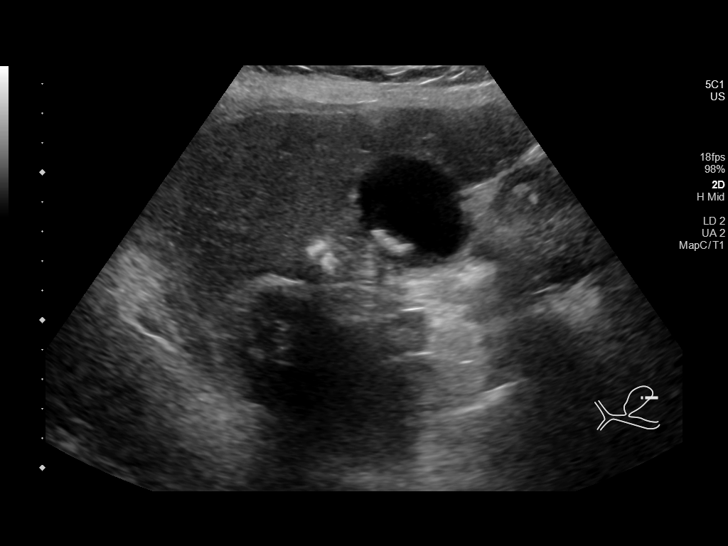
[im 22/85]
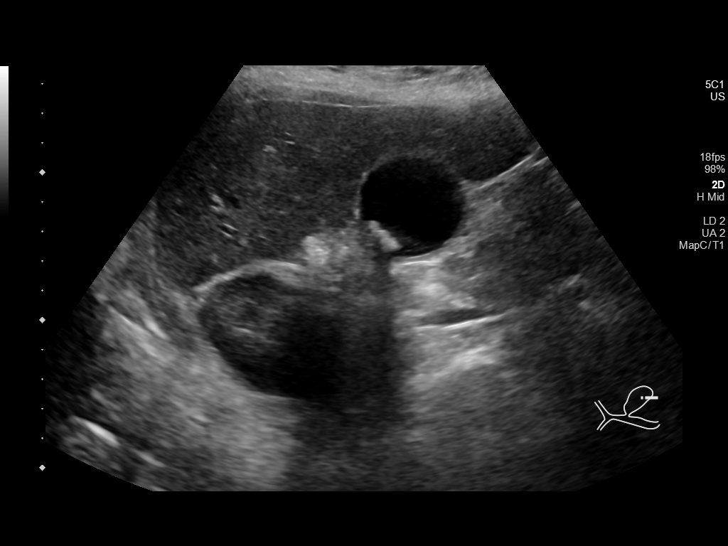
[im 29/85]
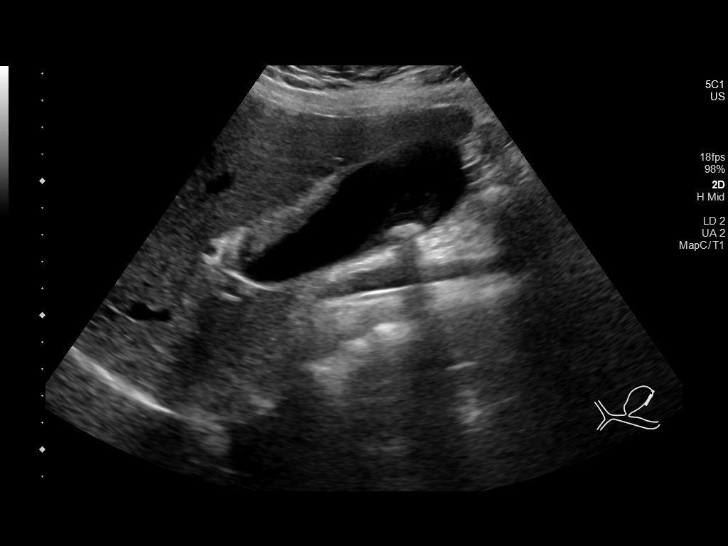
[im 32/85]
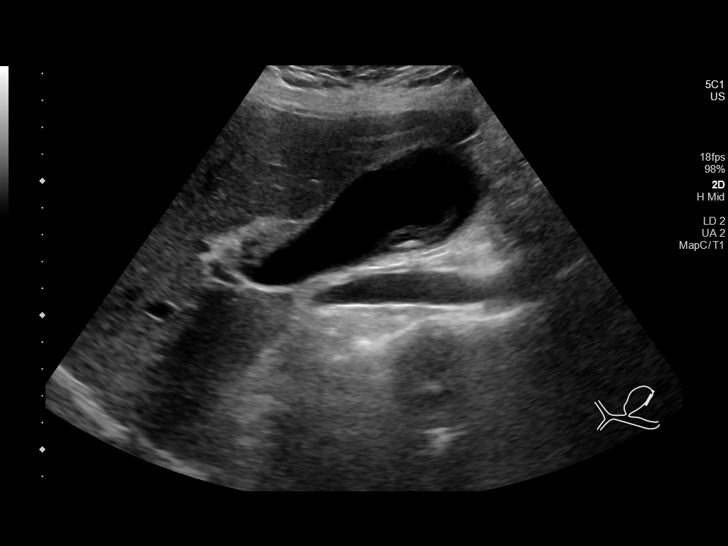
[im 39/85]
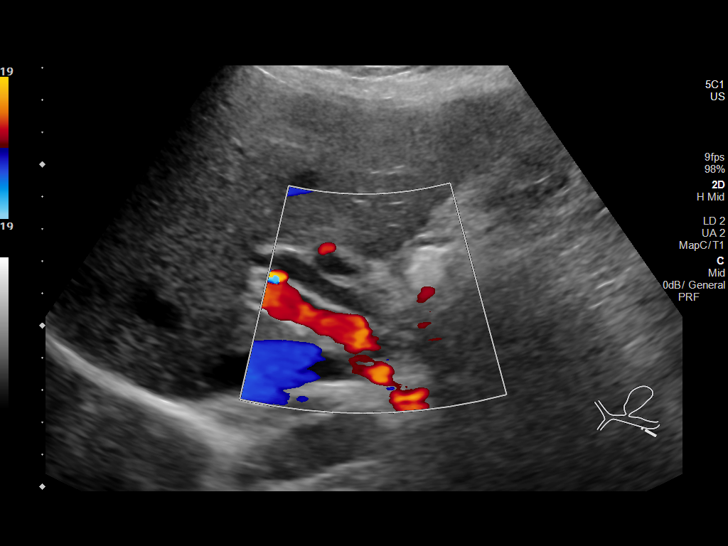
[im 46/85]
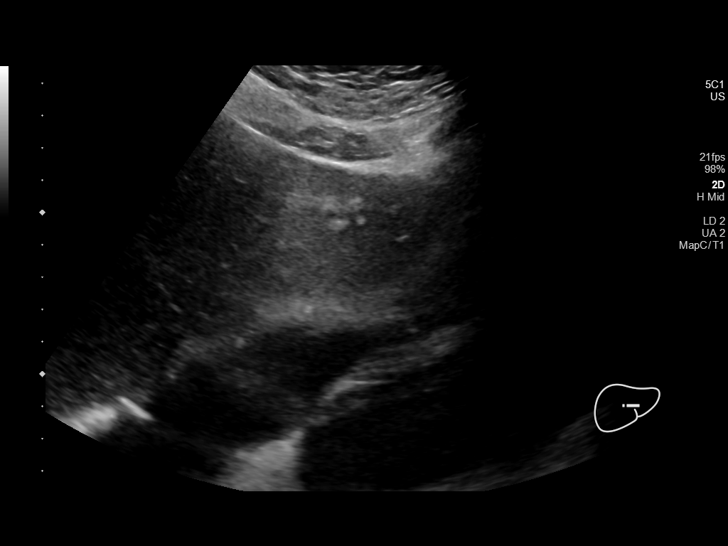
[im 53/85]
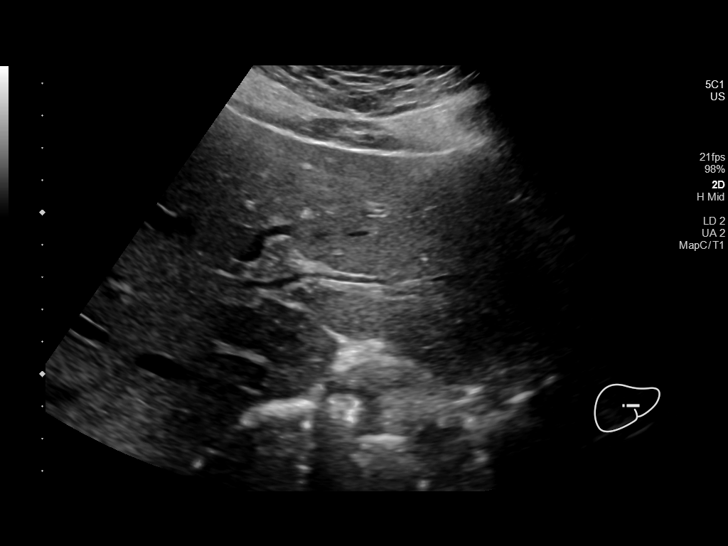
[im 57/85]
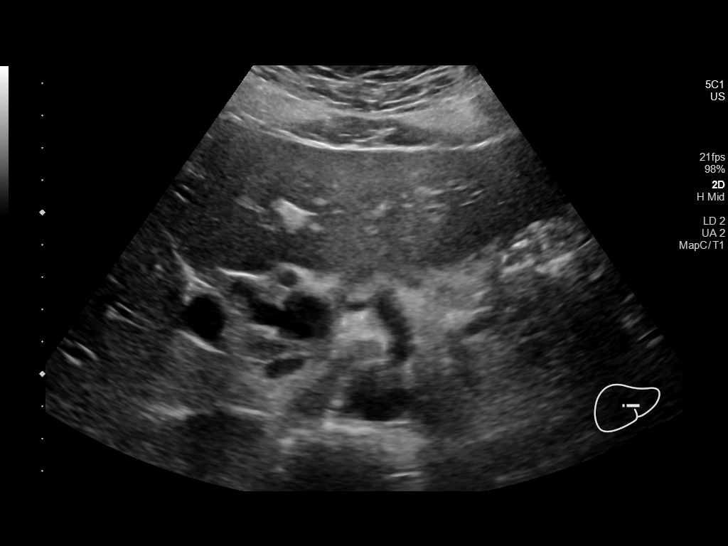
[im 64/85]
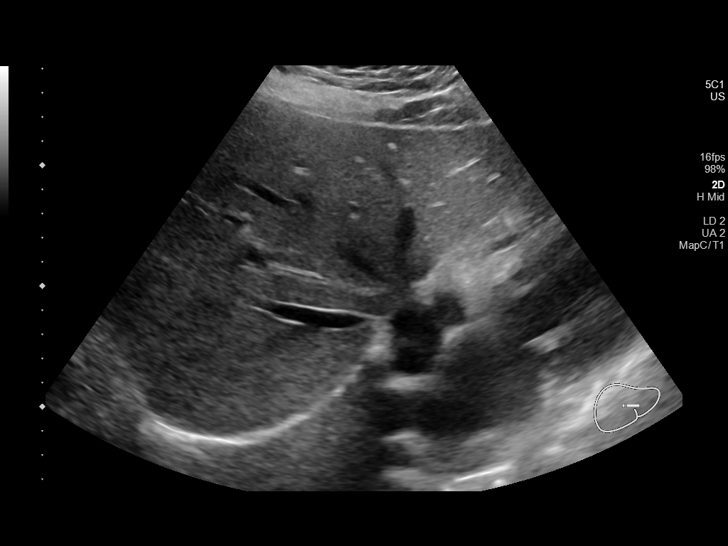
[im 71/85]
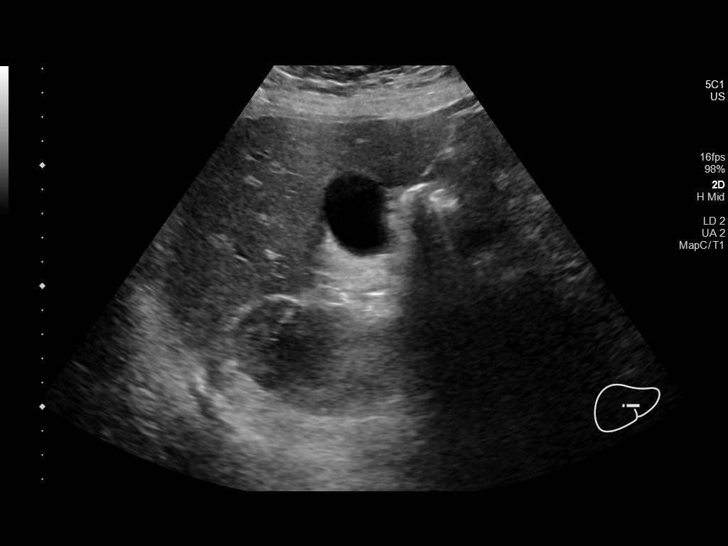
[im 78/85]
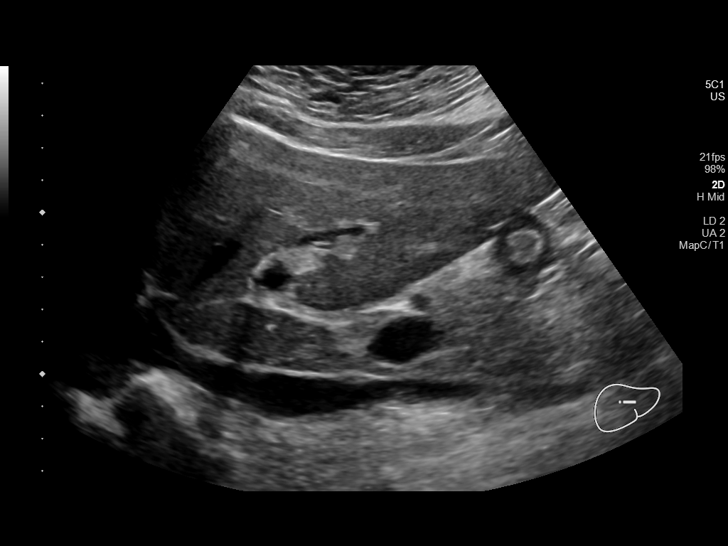
[im 85/85]
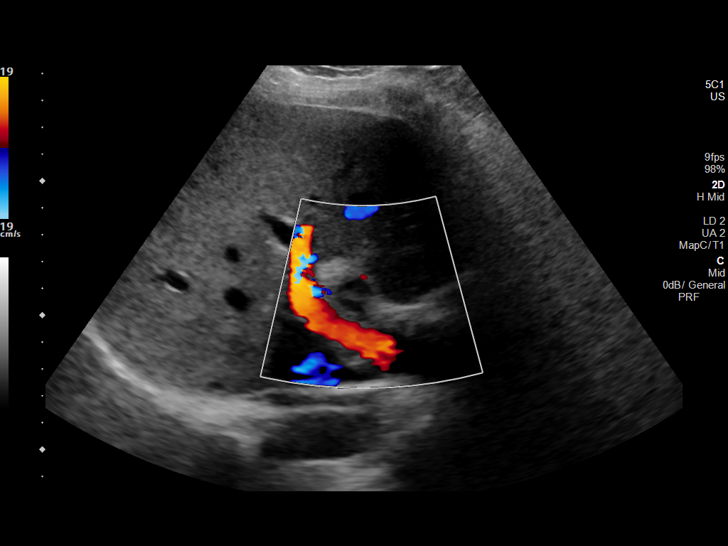

[14 of 25 positions shown; findings below may reference images not displayed]

FINDINGS: Gallbladder:

Multiple shadowing mobile gallstones are present. Mobile sludge is
present. Gallbladder wall thickness is within normal limits at
mm. No sonographic Murphy sign is reported.

Common bile duct:

Diameter: 6 mm, upper limits of normal.

Liver:

No focal lesion identified. Within normal limits in parenchymal
echogenicity. Portal vein is patent on color Doppler imaging with
normal direction of blood flow towards the liver.

Other: None.
IMPRESSION: 1. Cholelithiasis without evidence for acute cholecystitis.

## 2021-04-02 IMAGING — DX DG CHEST 1V PORT
1 series · 1 of 1 positions shown · non-contrast
Comparison: None.

CLINICAL DATA: COVID.

EXAM:
PORTABLE CHEST 1 VIEW

[chest ap]
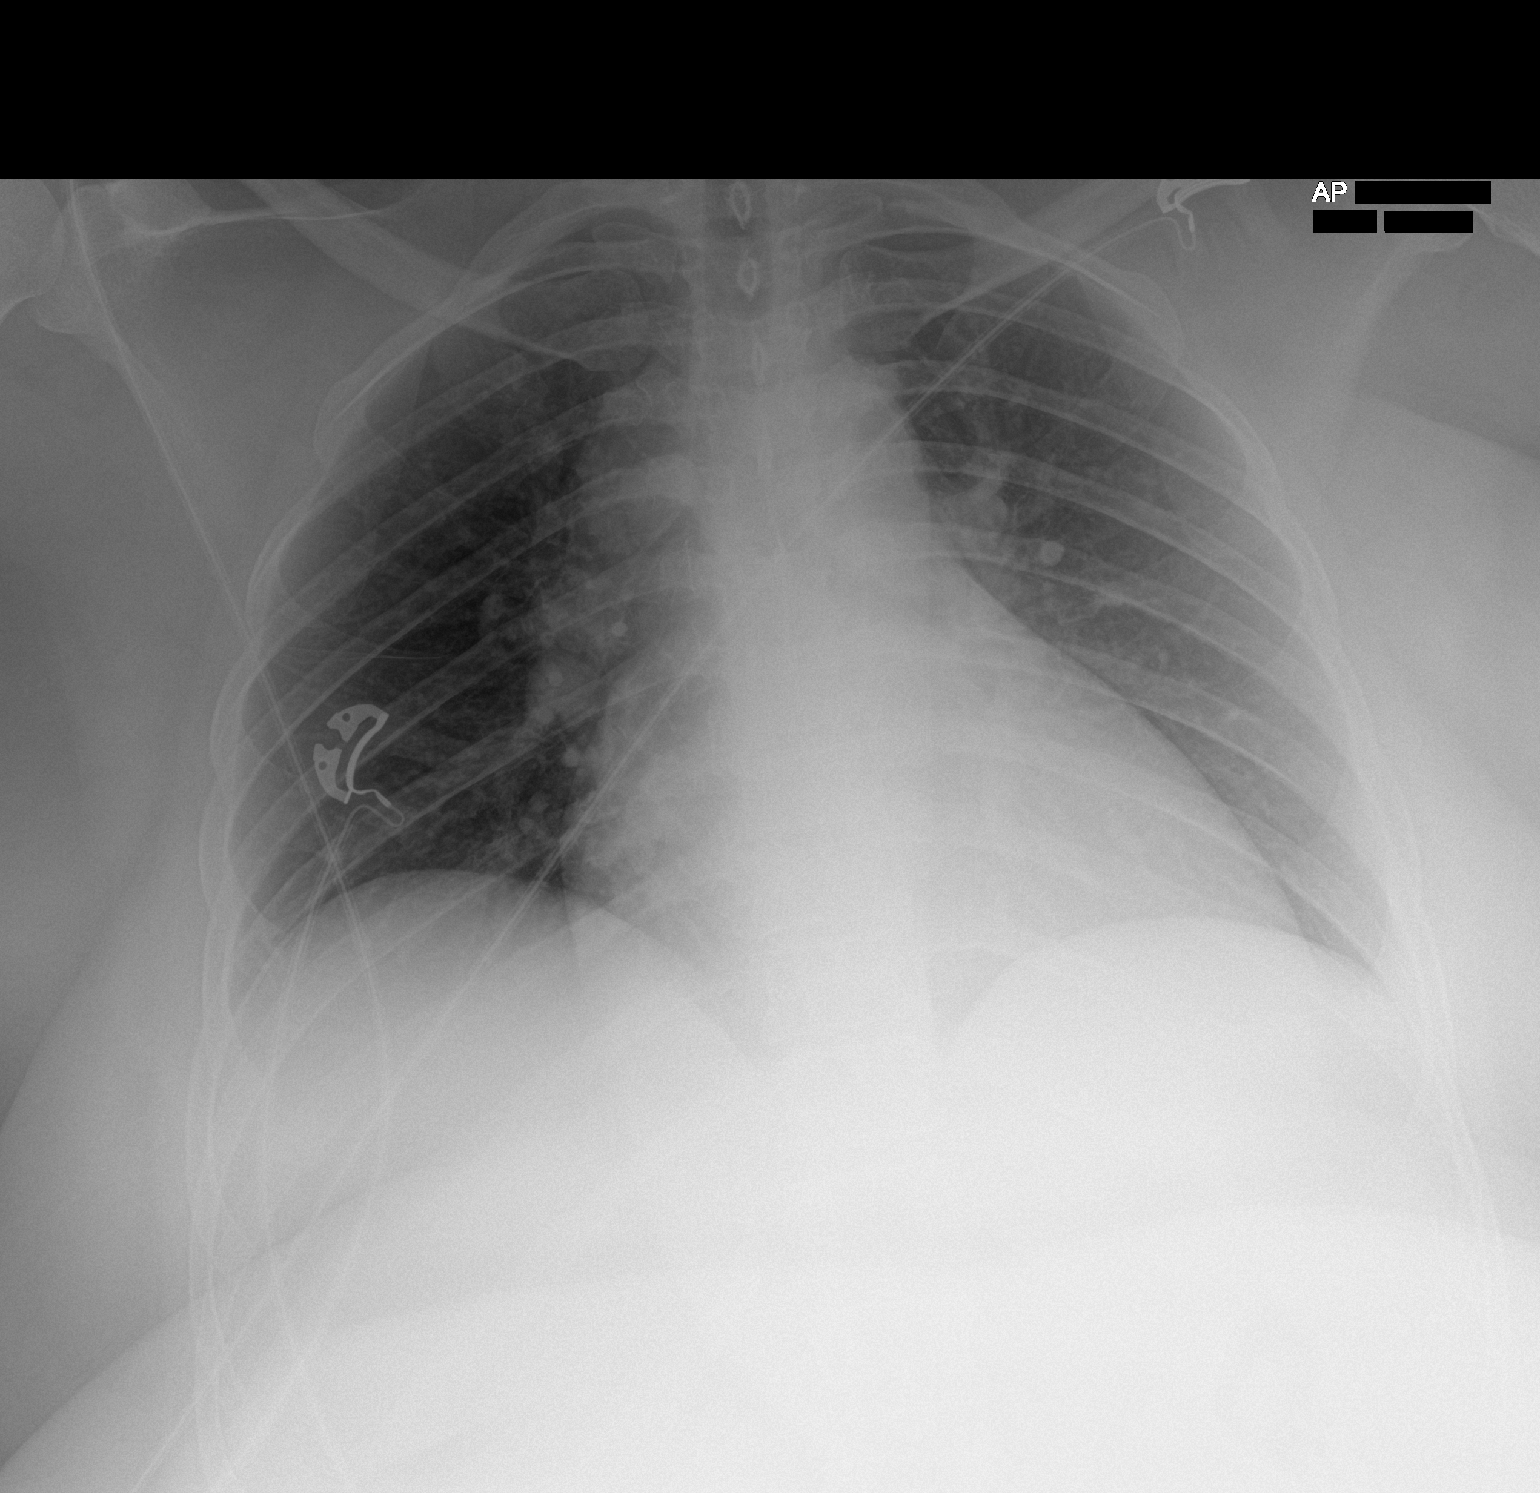

[1 of 1 positions shown; findings below may reference images not displayed]

FINDINGS: Generous heart size accentuated by lower lung volumes. Fullness in
the right paratracheal region which may be technical. Symmetric
hila. There is no edema, consolidation, effusion, or pneumothorax.
IMPRESSION: 1. No evidence of pneumonia.
2. Generous heart size and right paratracheal stripe, recommend
two-view radiograph after recovery.

## 2021-05-13 IMAGING — RF DG CHOLANGIOGRAM OPERATIVE
1 series · 5 of 5 positions shown · non-contrast
Comparison: MRCP-10/09/2020

CLINICAL DATA: Intraoperative cholangiogram during laparoscopic
cholecystectomy.

EXAM:
INTRAOPERATIVE CHOLANGIOGRAM
FLUOROSCOPY TIME:  7 seconds

[Series 1: run · 2 acquisitions, 5 frames shown]
[im 1/2]
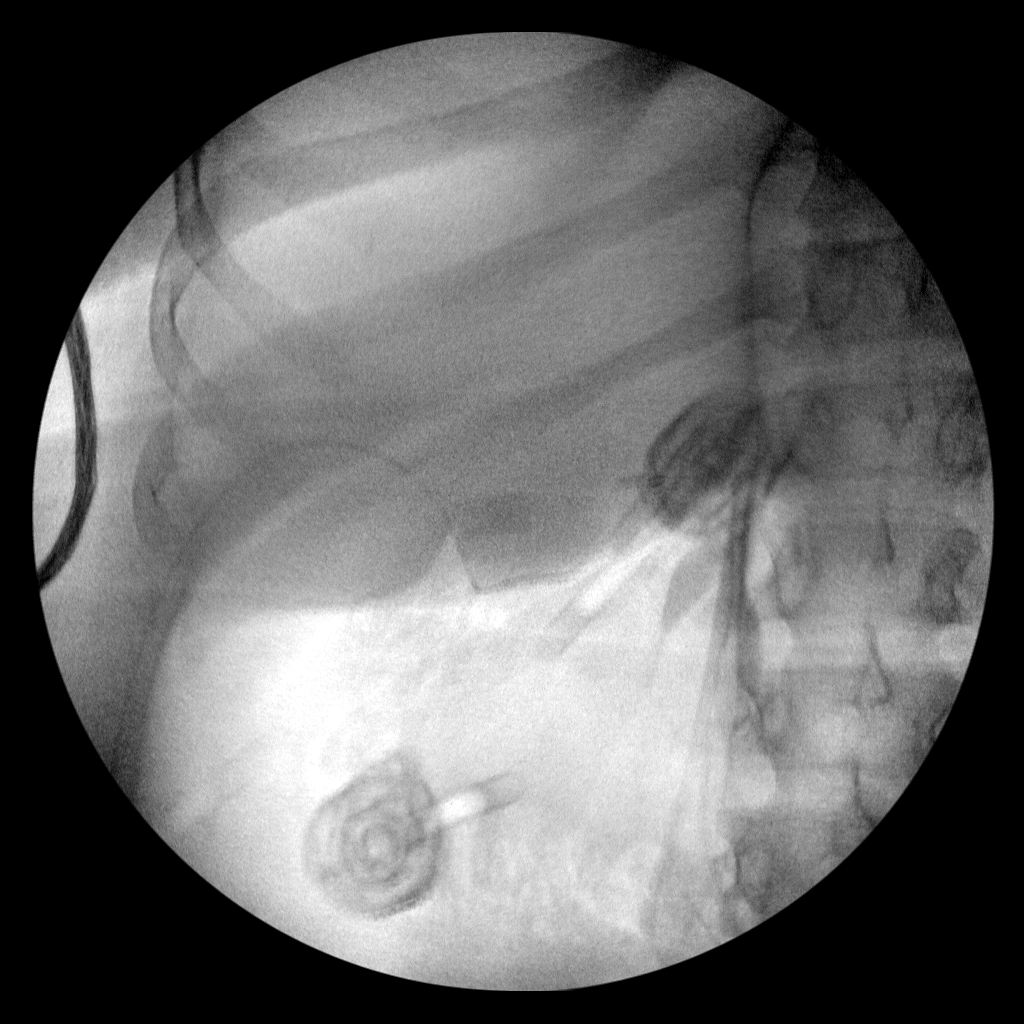
[im 2/2]
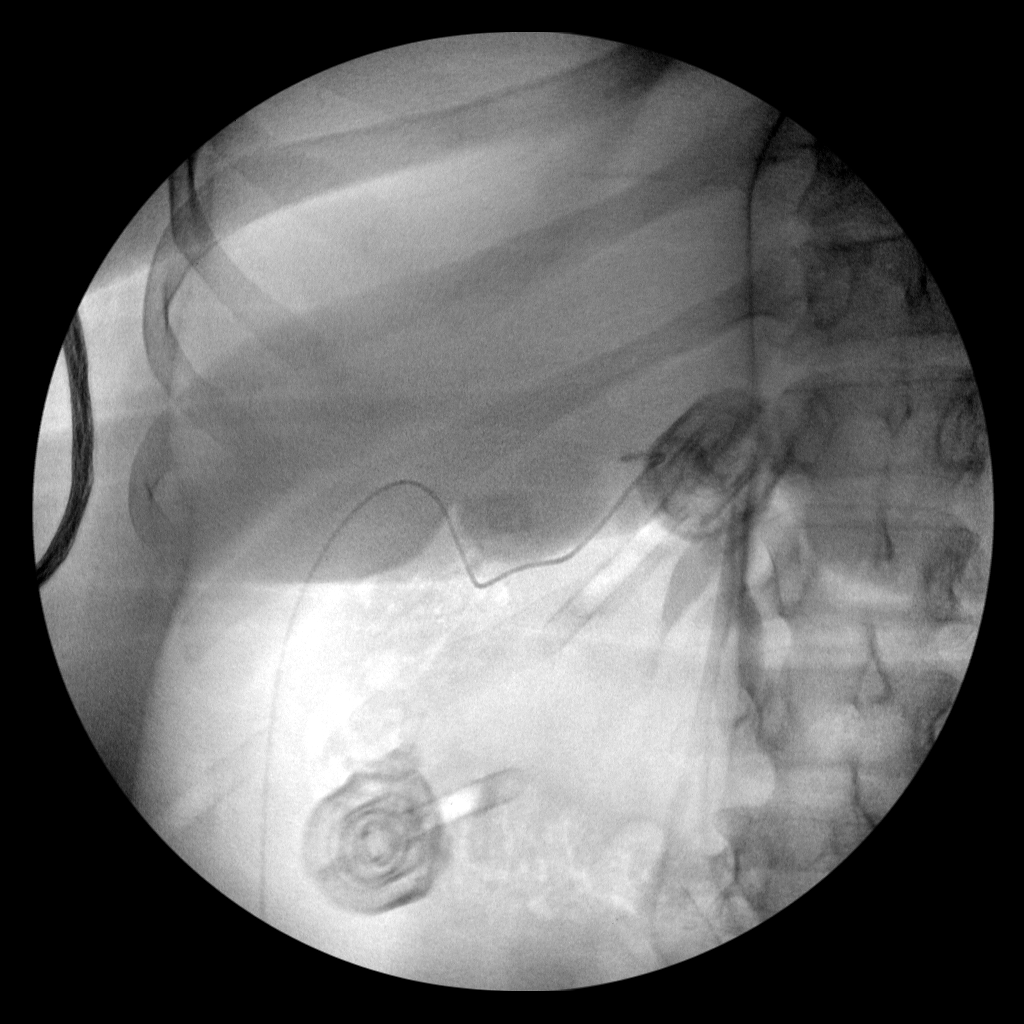
[im 2/2]
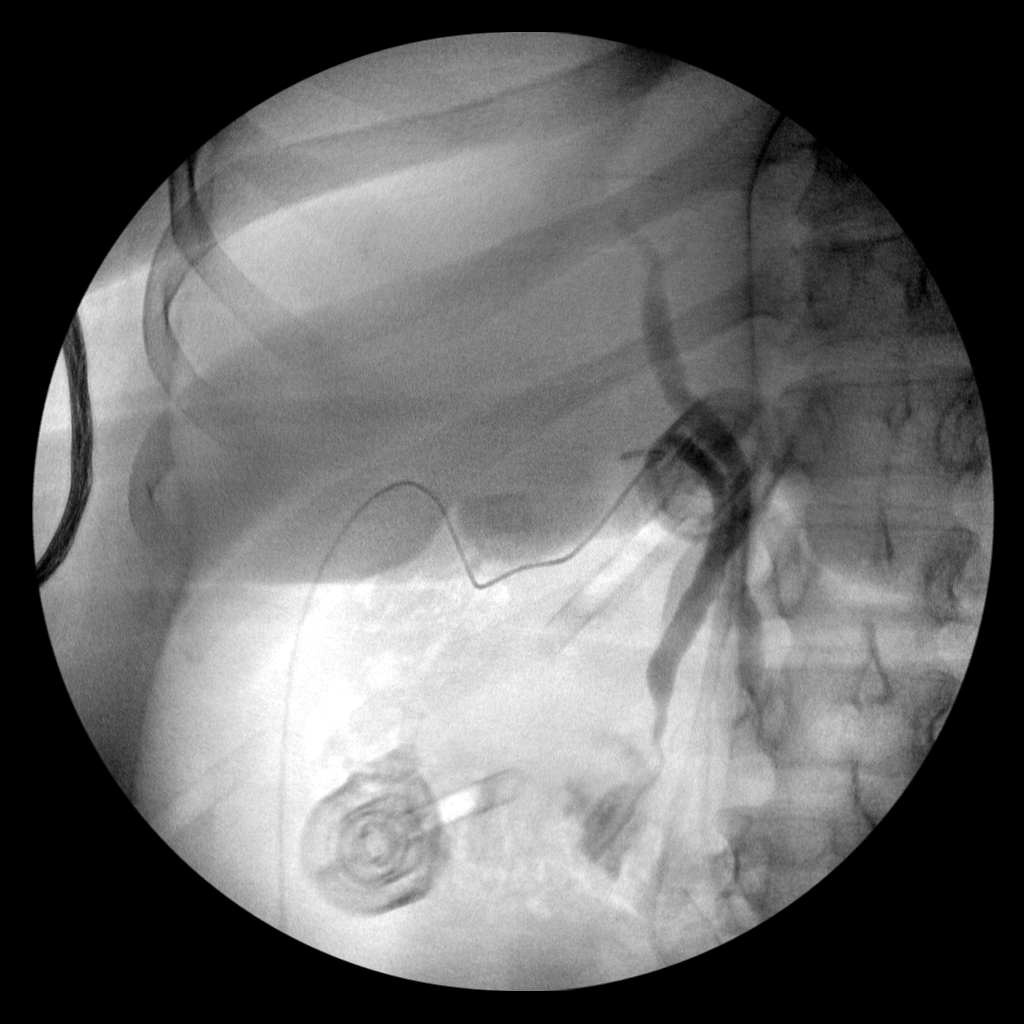
[im 2/2]
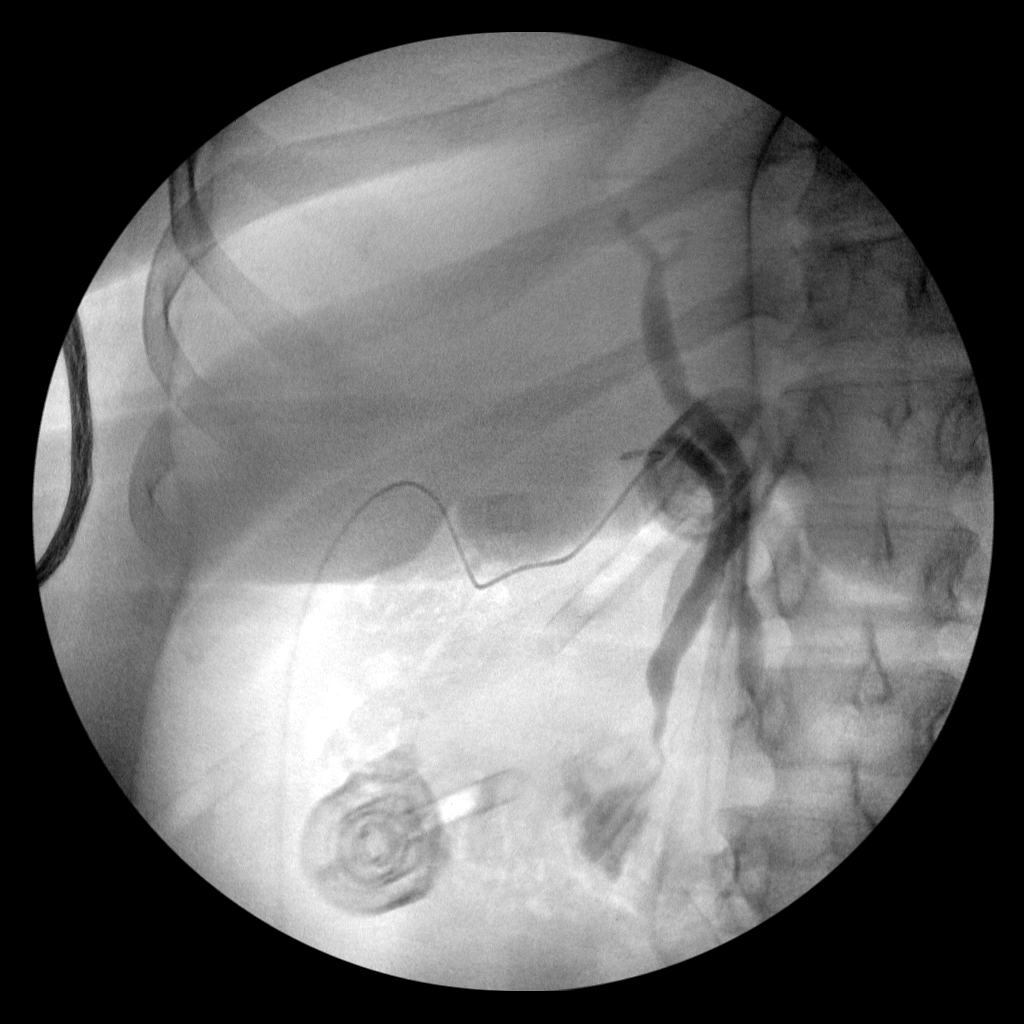
[im 2/2]
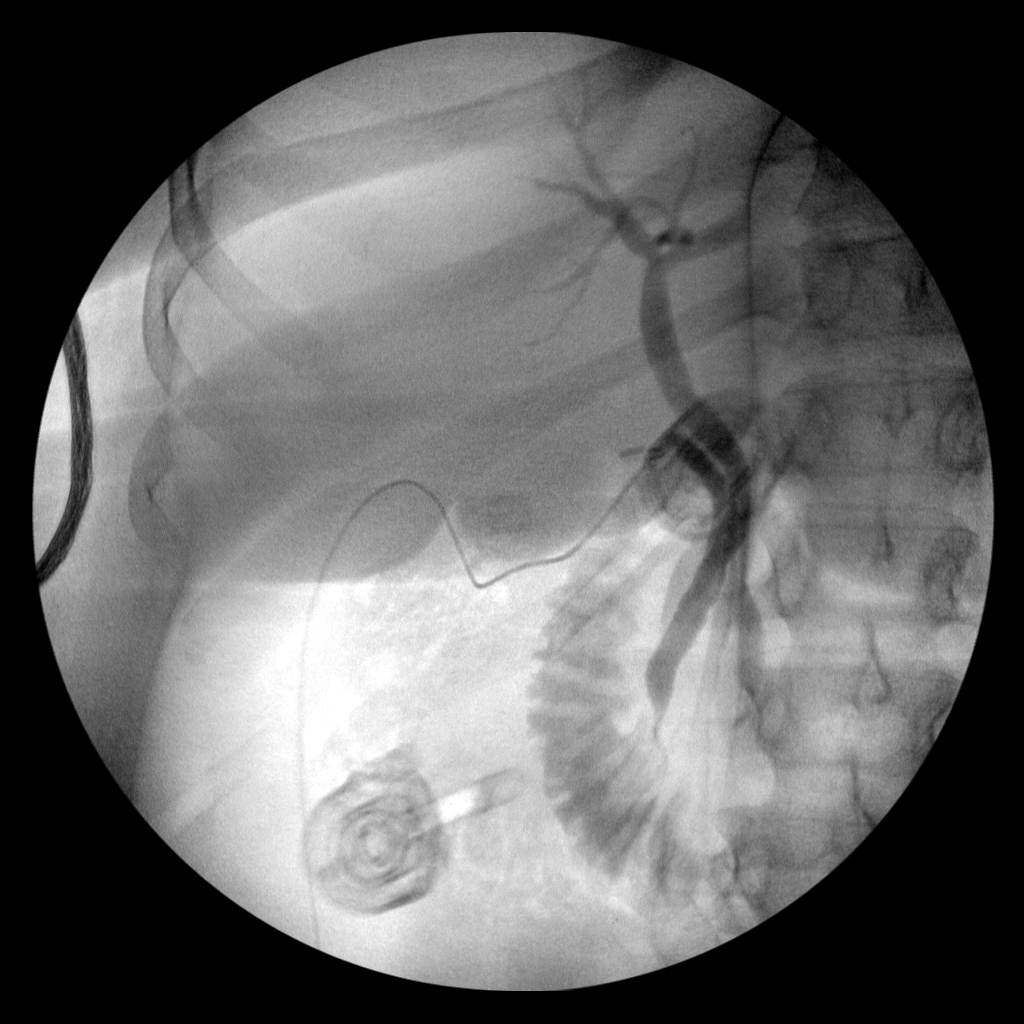

[5 of 5 positions shown; findings below may reference images not displayed]

FINDINGS: Intraoperative cholangiographic images of the right upper abdominal
quadrant during laparoscopic cholecystectomy are provided for
review.

Surgical clips overlie the expected location of the gallbladder
fossa.

Contrast injection demonstrates selective cannulation of the central
aspect of the cystic duct.

There is passage of contrast through the central aspect of the
cystic duct with filling of a non dilated common bile duct. There is
passage of contrast though the CBD and into the descending portion
of the duodenum.

There is minimal reflux of injected contrast into the common hepatic
duct and central aspect of the non dilated intrahepatic biliary
system.

There are no discrete filling defects within the opacified portions
of the biliary system to suggest the presence of
choledocholithiasis.
IMPRESSION: No evidence of choledocholithiasis.
